# Patient Record
Sex: Female | Born: 1965 | Race: Black or African American | Hispanic: No | Marital: Married | Smoking: Never smoker
Health system: Southern US, Community
[De-identification: ages and names within clinical notes are randomized; demographics above are authoritative.]

## PROBLEM LIST (undated history)

## (undated) DIAGNOSIS — E079 Disorder of thyroid, unspecified: Secondary | ICD-10-CM

## (undated) DIAGNOSIS — M792 Neuralgia and neuritis, unspecified: Secondary | ICD-10-CM

## (undated) HISTORY — PX: BREAST BIOPSY: SHX20

---

## 1999-01-18 ENCOUNTER — Emergency Department (HOSPITAL_COMMUNITY): Admission: EM | Admit: 1999-01-18 | Discharge: 1999-01-18 | Payer: Self-pay | Admitting: Emergency Medicine

## 2005-04-03 ENCOUNTER — Ambulatory Visit (HOSPITAL_COMMUNITY): Admission: RE | Admit: 2005-04-03 | Discharge: 2005-04-03 | Payer: Self-pay | Admitting: Emergency Medicine

## 2005-04-03 ENCOUNTER — Emergency Department (HOSPITAL_COMMUNITY): Admission: EM | Admit: 2005-04-03 | Discharge: 2005-04-03 | Payer: Self-pay | Admitting: Emergency Medicine

## 2005-12-14 ENCOUNTER — Emergency Department (HOSPITAL_COMMUNITY): Admission: EM | Admit: 2005-12-14 | Discharge: 2005-12-14 | Payer: Self-pay | Admitting: Family Medicine

## 2006-11-01 HISTORY — PX: OTHER SURGICAL HISTORY: SHX169

## 2007-11-13 ENCOUNTER — Encounter: Admission: RE | Admit: 2007-11-13 | Discharge: 2007-11-13 | Payer: Self-pay | Admitting: Internal Medicine

## 2009-06-16 ENCOUNTER — Emergency Department (HOSPITAL_COMMUNITY): Admission: EM | Admit: 2009-06-16 | Discharge: 2009-06-16 | Payer: Self-pay | Admitting: Emergency Medicine

## 2011-07-07 ENCOUNTER — Other Ambulatory Visit: Payer: Self-pay | Admitting: Family Medicine

## 2011-07-07 DIAGNOSIS — Z1231 Encounter for screening mammogram for malignant neoplasm of breast: Secondary | ICD-10-CM

## 2011-07-14 ENCOUNTER — Ambulatory Visit (HOSPITAL_COMMUNITY)
Admission: RE | Admit: 2011-07-14 | Discharge: 2011-07-14 | Disposition: A | Discharge status: Home / Self Care | Payer: Self-pay | Source: Ambulatory Visit | Attending: Family Medicine | Admitting: Family Medicine

## 2011-07-14 DIAGNOSIS — Z1231 Encounter for screening mammogram for malignant neoplasm of breast: Secondary | ICD-10-CM

## 2011-10-23 ENCOUNTER — Emergency Department (HOSPITAL_COMMUNITY)
Admission: EM | Admit: 2011-10-23 | Discharge: 2011-10-24 | Discharge status: Against Medical Advice | Payer: Self-pay | Attending: Emergency Medicine | Admitting: Emergency Medicine

## 2011-10-23 DIAGNOSIS — Z0389 Encounter for observation for other suspected diseases and conditions ruled out: Secondary | ICD-10-CM | POA: Insufficient documentation

## 2011-10-24 ENCOUNTER — Encounter: Payer: Self-pay | Admitting: *Deleted

## 2011-10-24 NOTE — ED Notes (Signed)
Called patient to a room and patient did not answer

## 2011-10-24 NOTE — ED Notes (Signed)
Patient with abdominal pain on her right side for about a month.  Patient has been taking tylenol, bc powder for pain without any relief.

## 2011-10-28 ENCOUNTER — Emergency Department (HOSPITAL_COMMUNITY): Payer: Self-pay

## 2011-10-28 ENCOUNTER — Encounter (HOSPITAL_COMMUNITY): Payer: Self-pay | Admitting: *Deleted

## 2011-10-28 ENCOUNTER — Emergency Department (HOSPITAL_COMMUNITY)
Admission: EM | Admit: 2011-10-28 | Discharge: 2011-10-28 | Disposition: A | Discharge status: Home / Self Care | Payer: Self-pay | Attending: Emergency Medicine | Admitting: Emergency Medicine

## 2011-10-28 DIAGNOSIS — Z79899 Other long term (current) drug therapy: Secondary | ICD-10-CM | POA: Insufficient documentation

## 2011-10-28 DIAGNOSIS — R10819 Abdominal tenderness, unspecified site: Secondary | ICD-10-CM | POA: Insufficient documentation

## 2011-10-28 DIAGNOSIS — R112 Nausea with vomiting, unspecified: Secondary | ICD-10-CM | POA: Insufficient documentation

## 2011-10-28 DIAGNOSIS — D1803 Hemangioma of intra-abdominal structures: Secondary | ICD-10-CM | POA: Insufficient documentation

## 2011-10-28 DIAGNOSIS — R109 Unspecified abdominal pain: Secondary | ICD-10-CM | POA: Insufficient documentation

## 2011-10-28 DIAGNOSIS — R319 Hematuria, unspecified: Secondary | ICD-10-CM | POA: Insufficient documentation

## 2011-10-28 LAB — COMPREHENSIVE METABOLIC PANEL
AST: 22 U/L (ref 0–37)
Albumin: 3.8 g/dL (ref 3.5–5.2)
Calcium: 9.7 mg/dL (ref 8.4–10.5)
Glucose, Bld: 119 mg/dL — ABNORMAL HIGH (ref 70–99)
Sodium: 136 mEq/L (ref 135–145)
Total Protein: 7.3 g/dL (ref 6.0–8.3)

## 2011-10-28 LAB — URINALYSIS, ROUTINE W REFLEX MICROSCOPIC
Glucose, UA: NEGATIVE mg/dL
Ketones, ur: NEGATIVE mg/dL
Nitrite: NEGATIVE
Protein, ur: NEGATIVE mg/dL
Specific Gravity, Urine: 1.02 (ref 1.005–1.030)

## 2011-10-28 LAB — URINE MICROSCOPIC-ADD ON

## 2011-10-28 LAB — CBC
HCT: 39.8 % (ref 36.0–46.0)
Hemoglobin: 13.7 g/dL (ref 12.0–15.0)
MCH: 31.1 pg (ref 26.0–34.0)
Platelets: 213 10*3/uL (ref 150–400)
RBC: 4.4 MIL/uL (ref 3.87–5.11)

## 2011-10-28 LAB — DIFFERENTIAL
Basophils Absolute: 0 10*3/uL (ref 0.0–0.1)
Basophils Relative: 0 % (ref 0–1)
Eosinophils Absolute: 0 10*3/uL (ref 0.0–0.7)
Eosinophils Relative: 1 % (ref 0–5)
Lymphocytes Relative: 42 % (ref 12–46)
Lymphs Abs: 1.4 10*3/uL (ref 0.7–4.0)
Monocytes Relative: 7 % (ref 3–12)
Neutro Abs: 1.6 10*3/uL — ABNORMAL LOW (ref 1.7–7.7)

## 2011-10-28 LAB — LIPASE, BLOOD: Lipase: 32 U/L (ref 11–59)

## 2011-10-28 MED ORDER — HYDROMORPHONE HCL PF 1 MG/ML IJ SOLN
1.0000 mg | Freq: Once | INTRAMUSCULAR | Status: AC
  Administered 2011-10-28: 1 mg via INTRAVENOUS
  Filled 2011-10-28: qty 1

## 2011-10-28 MED ORDER — SODIUM CHLORIDE 0.9 % IV BOLUS (SEPSIS)
1000.0000 mL | Freq: Once | INTRAVENOUS | Status: AC
  Administered 2011-10-28: 1000 mL via INTRAVENOUS

## 2011-10-28 MED ORDER — TRAMADOL HCL 50 MG PO TABS: 50.0000 mg | ORAL_TABLET | Freq: Four times a day (QID) | ORAL | Status: AC | PRN

## 2011-10-28 MED ORDER — GADOBENATE DIMEGLUMINE 529 MG/ML IV SOLN
13.0000 mL | Freq: Once | INTRAVENOUS | Status: AC | PRN
  Administered 2011-10-28: 13 mL via INTRAVENOUS

## 2011-10-28 MED ORDER — KETOROLAC TROMETHAMINE 30 MG/ML IJ SOLN
30.0000 mg | Freq: Once | INTRAMUSCULAR | Status: AC
  Administered 2011-10-28: 30 mg via INTRAVENOUS
  Filled 2011-10-28: qty 1

## 2011-10-28 MED ORDER — ONDANSETRON HCL 4 MG/2ML IJ SOLN
4.0000 mg | Freq: Once | INTRAMUSCULAR | Status: AC
  Administered 2011-10-28: 4 mg via INTRAVENOUS
  Filled 2011-10-28: qty 2

## 2011-10-28 NOTE — ED Notes (Signed)
LMP--started 12/25--normal flow.

## 2011-10-28 NOTE — ED Notes (Signed)
Pt reports sharp pain in R side x2 months that has been worsening over last week. Using OTC pain meds without relief. Reports not being able to sleep at night due to pain.

## 2011-10-28 NOTE — ED Notes (Signed)
Spoke with MRI tech, will come to get patient in few minutes.

## 2011-10-28 NOTE — ED Notes (Signed)
Returned from MRI-alert/family at bedside.

## 2011-10-28 NOTE — ED Notes (Signed)
Pt remains at MRI

## 2011-10-28 NOTE — ED Provider Notes (Signed)
History     CSN: [id number]  Arrival date & time 10/28/11  0800   First MD Initiated Contact with Patient 10/28/11 0815      Chief Complaint  Patient presents with  . Flank Pain    (Consider location/radiation/quality/duration/timing/severity/associated sxs/prior treatment) HPI Comments: No associated urinary sx, vaginal sx, change in stool.  Has not had similar sx in past  Patient is a 45 y.o. female presenting with flank pain. The history is provided by the patient. No language interpreter was used.  Flank Pain This is a new problem. Episode onset: has been present for 2 months but worse in last 1 week. The problem occurs constantly. The problem has been gradually worsening. Associated symptoms include abdominal pain. Pertinent negatives include no chest pain, no headaches and no shortness of breath. The symptoms are aggravated by bending and twisting. The symptoms are relieved by nothing. She has tried acetaminophen (nsaid) for the symptoms. The treatment provided mild relief.    History reviewed. No pertinent past medical history.  History reviewed. No pertinent past surgical history.  No family history on file.  History  Substance Use Topics  . Smoking status: Never Smoker   . Smokeless tobacco: Not on file  . Alcohol Use: No    OB History    Grav Para Term Preterm Abortions TAB SAB Ect Mult Living                  Review of Systems  Constitutional: Negative for fever, activity change, appetite change and fatigue.  HENT: Negative for congestion, sore throat, rhinorrhea, neck pain and neck stiffness.   Respiratory: Negative for cough and shortness of breath.   Cardiovascular: Negative for chest pain and palpitations.  Gastrointestinal: Positive for nausea, vomiting and abdominal pain. Negative for diarrhea and constipation.  Genitourinary: Positive for flank pain. Negative for dysuria, urgency, frequency, vaginal bleeding and vaginal discharge.  Musculoskeletal:  Negative for myalgias and arthralgias.  Neurological: Negative for dizziness, weakness, light-headedness, numbness and headaches.  All other systems reviewed and are negative.    Allergies  Review of patient's allergies indicates no known allergies.  Home Medications   Current Outpatient Rx  Name Route Sig Dispense Refill  . ACETAMINOPHEN 500 MG PO TABS Oral Take 1,000 mg by mouth every 6 (six) hours as needed. pain     . AMITRIPTYLINE HCL 150 MG PO TABS Oral Take 150 mg by mouth at bedtime.      Deeann Dowse BODY PAIN PO Oral Take 1 packet by mouth 2 (two) times daily as needed. pain     . DOXYCYCLINE HYCLATE 100 MG PO TABS Oral Take 100 mg by mouth 2 (two) times daily. This is a cont rx. Pt takes 1 cap twice daily for acne.    Marland Kitchen NAPROXEN SODIUM 220 MG PO TABS Oral Take 220 mg by mouth 2 (two) times daily with a meal. pain     . NORETHIN-ETH ESTRAD TRIPHASIC 0.5/1/0.5-35 MG-MCG PO TABS Oral Take 1 tablet by mouth daily.      . TRAMADOL HCL 50 MG PO TABS Oral Take 1 tablet (50 mg total) by mouth every 6 (six) hours as needed for pain. Maximum dose= 8 tablets per day 15 tablet 0    BP 127/78  Pulse 61  Temp(Src) 98.7 F (37.1 C) (Oral)  Resp 16  SpO2 100%  LMP 10/26/2011  Physical Exam  Nursing note and vitals reviewed. Constitutional: She is oriented to person, place, and time. She appears  well-developed and well-nourished. No distress.  HENT:  Head: Normocephalic and atraumatic.  Mouth/Throat: Oropharynx is clear and moist. No oropharyngeal exudate.  Eyes: Conjunctivae and EOM are normal. Pupils are equal, round, and reactive to light.  Neck: Normal range of motion. Neck supple.  Cardiovascular: Normal rate, regular rhythm, normal heart sounds and intact distal pulses.  Exam reveals no gallop and no friction rub.   No murmur heard. Pulmonary/Chest: Effort normal and breath sounds normal. No respiratory distress.  Abdominal: Soft. Bowel sounds are normal. There is tenderness  (pain on palpation R lateral abdomen and flank.  No murphy sign or RLQ pain). There is no rebound and no guarding.       No cva tenderness  Musculoskeletal: Normal range of motion. She exhibits no tenderness.  Neurological: She is alert and oriented to person, place, and time. No cranial nerve deficit.  Skin: Skin is warm and dry.    ED Course  Procedures (including critical care time)  Labs Reviewed  CBC - Abnormal; Notable for the following:    WBC 3.3 (*)    All other components within normal limits  DIFFERENTIAL - Abnormal; Notable for the following:    Neutro Abs 1.6 (*)    All other components within normal limits  COMPREHENSIVE METABOLIC PANEL - Abnormal; Notable for the following:    Glucose, Bld 119 (*)    GFR calc non Af Amer 75 (*)    GFR calc Af Amer 87 (*)    All other components within normal limits  URINALYSIS, ROUTINE W REFLEX MICROSCOPIC - Abnormal; Notable for the following:    Hgb urine dipstick LARGE (*)    All other components within normal limits  URINE MICROSCOPIC-ADD ON - Abnormal; Notable for the following:    Squamous Epithelial / LPF FEW (*)    Bacteria, UA FEW (*)    All other components within normal limits  LIPASE, BLOOD   Ct Abdomen Pelvis Wo Contrast  10/28/2011  *RADIOLOGY REPORT*  Clinical Data: Right flank pain.  CT ABDOMEN AND PELVIS WITHOUT CONTRAST  Technique:  Multidetector CT imaging of the abdomen and pelvis was performed following the standard protocol without intravenous contrast.  Comparison: None  Findings: The lung bases are clear.  There are multiple liver lesions ( best seen on liver windows). These are not cysts but could be benign lesions such as hemangiomas or adenomas.  MRI without and with contrast is recommended for further evaluation.  The spleen is normal in size.  No focal lesions.  The pancreas is normal.  The adrenal glands and kidneys demonstrate no abnormalities.  No renal or obstructing ureteral calculi.  No mass lesions.   The stomach, duodenum, small bowel and colon are grossly normal without oral contrast.  The appendix is normal.  No mesenteric or retroperitoneal masses or lymphadenopathy.  The aorta is normal in caliber.  The uterus demonstrates multiple fibroids.  The ovaries are normal. The bladder is decompressed but otherwise normal.  No pelvic mass, adenopathy or free pelvic fluid collection.  No inguinal mass or hernia.  The bony structures are intact.  IMPRESSION:  1.  No renal or obstructing ureteral calculi identified. 2.  Multiple indeterminate liver lesions. Abdominal MRI without and with contrast is recommended for further evaluation and characterization.  Original Report Authenticated By: P. Loralie Champagne, M.D.   Mr Abdomen W Wo Contrast  10/28/2011  *RADIOLOGY REPORT*  Clinical Data: Multiple liver lesions seen on recent CT scan.  MRI ABDOMEN WITH  AND WITHOUT CONTRAST  Technique:  Multiplanar multisequence MR imaging of the abdomen was performed both before and after administration of intravenous contrast.  Contrast: 13mL MULTIHANCE GADOBENATE DIMEGLUMINE 529 MG/ML IV SOLN  Comparison: CT scan 10/28/2011.  Findings: As demonstrated on the recent CT scan there are multiple liver lesions.  There are 3 hemangiomas in the right hepatic lobe. The largest lesion measures 2.3 cm.  These demonstrate typical MR imaging features of hemangiomas.  The largest liver lesion is in the medial segment of the left lobe and measures a maximum of 4.2 cm.  This lesion demonstrates very slight increased T2 signal intensity and is smoothly marginated and well circumscribed.  It demonstrates arterial phase enhancement and washes out rapidly.  Delayed imaging shows slight central enhancement.  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 There is a second similar lesion in the right hepatic lobe inferiorly at the level of the upper pole the right kidney.  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 If this patient is on birth  control pills hepatic adenomas are also possible.  No biliary dilatation.  The gallbladder is normal.  No common bile duct dilatation.  The pancreas is normal.  The adrenal glands and kidneys are normal.  The spleen is normal.  No upper abdominal mass or lymphadenopathy.  The aorta is normal in caliber.  The major branch vessels are normal.  No significant bony findings.  IMPRESSION:  1.  Five benign appearing liver lesions.  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 Hepatic adenomas would be another consideration if the patient is on birth control pills.  Recommend follow-up MR examination in 6 months to document stability. 2.  The remainder of the abdomen is unremarkable.  Original Report Authenticated By: P. Loralie Champagne, M.D.     1. Flank pain   2. Hematuria   3. Hemangioma of liver       MDM  CT imaging showed some questionable nodules the liver. MRI is recommended to further evaluate these. The patient has no primary care physician therefore I performed an MRI. These are consistent with benign lesions. Remainder of her laboratory studies are relatively unremarkable except for the presence of hematuria. She is instructed to followup with her primary care physician. She is provided Ultram for pain. I did not prescribe an NSAID secondary to hematuria. She's instructed to return for worsening of her symptoms.        Dayton Bailiff, MD 10/28/11 1256

## 2012-02-14 ENCOUNTER — Emergency Department (HOSPITAL_COMMUNITY)
Admission: EM | Admit: 2012-02-14 | Discharge: 2012-02-14 | Disposition: A | Discharge status: Home / Self Care | Payer: Self-pay | Attending: Emergency Medicine | Admitting: Emergency Medicine

## 2012-02-14 ENCOUNTER — Encounter (HOSPITAL_COMMUNITY): Payer: Self-pay | Admitting: Emergency Medicine

## 2012-02-14 DIAGNOSIS — M79604 Pain in right leg: Secondary | ICD-10-CM

## 2012-02-14 DIAGNOSIS — M79609 Pain in unspecified limb: Secondary | ICD-10-CM | POA: Insufficient documentation

## 2012-02-14 MED ORDER — HYDROCODONE-ACETAMINOPHEN 5-325 MG PO TABS: 1.0000 | ORAL_TABLET | Freq: Once | ORAL | Status: DC

## 2012-02-14 MED ORDER — HYDROCODONE-ACETAMINOPHEN 5-325 MG PO TABS
ORAL_TABLET | ORAL | Status: AC
  Administered 2012-02-14: 23:00:00
  Filled 2012-02-14: qty 1

## 2012-02-14 MED ORDER — ENOXAPARIN SODIUM 60 MG/0.6ML ~~LOC~~ SOLN
1.0000 mg/kg | Freq: Once | SUBCUTANEOUS | Status: AC
  Administered 2012-02-14: 60 mg via SUBCUTANEOUS
  Filled 2012-02-14 (×2): qty 0.6

## 2012-02-14 NOTE — ED Notes (Signed)
Pt has a place behind her right knee that she states has been bothering her for a little over a week  Pt states it hurts at night  Pt states she also has one on her head as well  No drainage noted from the knee

## 2012-02-14 NOTE — Discharge Instructions (Signed)
We need to obtain an ultrasound of your legs to make sure you do not have a blood clot. You have been given an sheet which you will need to bring with you in the morning to the vascular lab for your study. You have been given an injection of Lovenox this evening to treat you in case this is a clot. You can try using a warm compress over the area this evening. If you develop shortness of breath, chest pain, increasing swelling or redness to the leg this evening, or any other worrisome symptoms, please return to the ER immediately.

## 2012-02-14 NOTE — ED Provider Notes (Signed)
History     CSN: [id number]  Arrival date & time 02/14/12  2011   First MD Initiated Contact with Patient 02/14/12 2112      Chief Complaint  Patient presents with  . Abscess    (Consider location/radiation/quality/duration/timing/severity/associated sxs/prior treatment) The history is provided by the patient.   46 year old female who presents with right leg pain. Pain is located in the popliteal fossa. She states that she has had pain to the area for about the past week, but just noticed swelling in the area today. No known injury or hx of the same symptoms. Pain is dull and throbbing and worsens with standing. No tx prior to arrival.  Patient has no prior history of DVT/PE. Denies recent trauma, surgery, or prolonged immobilization. No hx clotting disorders. Denies hemoptysis, cough, chest pain, shortness of breath. She is on oral contraceptives. She's not a smoker.  History reviewed. No pertinent past medical history.  History reviewed. No pertinent past surgical history.  Family History  Problem Relation Age of Onset  . Hypertension Mother   . Hypertension Father     History  Substance Use Topics  . Smoking status: Never Smoker   . Smokeless tobacco: Not on file  . Alcohol Use: No    OB History    Grav Para Term Preterm Abortions TAB SAB Ect Mult Living                  Review of Systems  Constitutional: Negative for fever, chills, activity change and appetite change.  HENT: Negative.   Respiratory: Negative for cough, chest tightness and shortness of breath.   Cardiovascular: Negative for chest pain, palpitations and leg swelling.  Gastrointestinal: Negative for nausea, vomiting and abdominal pain.  Musculoskeletal: Negative for joint swelling.  Skin: Negative for color change and rash.  Neurological: Negative for dizziness, weakness and numbness.    Allergies  Review of patient's allergies indicates no known allergies.  Home Medications   Current  Outpatient Rx  Name Route Sig Dispense Refill  . AMITRIPTYLINE HCL 150 MG PO TABS Oral Take 150 mg by mouth at bedtime.      Marland Kitchen BIOTIN 10 MG PO TABS Oral Take 1 tablet by mouth daily.    Marland Kitchen CALCIUM CARBONATE-VITAMIN D 250-125 MG-UNIT PO TABS Oral Take 1 tablet by mouth daily.    Marland Kitchen DOXYCYCLINE HYCLATE 100 MG PO TABS Oral Take 100 mg by mouth 2 (two) times daily. This is a cont rx. Pt takes 1 cap twice daily for acne.    Kathrynn Running ESTRAD TRIPHASIC 0.5/1/0.5-35 MG-MCG PO TABS Oral Take 1 tablet by mouth daily.        BP 143/83  Pulse 68  Temp(Src) 97.8 F (36.6 C) (Oral)  Resp 16  Ht 5\' 4"  (1.626 m)  Wt 135 lb (61.236 kg)  BMI 23.17 kg/m2  SpO2 100%  LMP 01/23/2012  Physical Exam  Nursing note and vitals reviewed. Constitutional: She appears well-developed and well-nourished. No distress.  HENT:  Head: Normocephalic and atraumatic.  Eyes: EOM are normal. Pupils are equal, round, and reactive to light.  Neck: Normal range of motion.  Cardiovascular: Normal rate, regular rhythm and normal heart sounds.   Pulmonary/Chest: Effort normal and breath sounds normal. No respiratory distress. She exhibits no tenderness.  Abdominal: Soft. There is no tenderness.  Musculoskeletal:       Legs:      Right popliteal fossa: Area with mild edema. No overlying ecchymoses, erythema noted. Area is TTP  without palp cord. Calf NT to palp without notable edema. Knee with FROM, ligaments grossly intact to drawer/stress testing. NVI with DP/PT pulses intact.  Neurological: She is alert.  Skin: Skin is warm and dry. She is not diaphoretic.  Psychiatric: She has a normal mood and affect.    ED Course  Procedures (including critical care time)  Labs Reviewed - No data to display No results found.   1. Right leg pain       MDM  Patient with tenderness and swelling behind right knee without known injury. No evidence to suggest bony injury which would necessitate XR. This appears to be less  likely consistent with a Baker's cyst. This may be a superficial thrombophlebitis or varicose veins, but will proceed with [place] to r/o DVT. She has no symptoms to suggest PE at this time. Her vital signs are stable; she is not hypoxic, tachypneic, or tachycardic. Vascular is not currently in house, so cannot obtain US this evening - will plan to bring her back in the AM for a study. Prophylactic injection of Lovenox given prior to discharge, and she was given directions and a copy of the order form. Discussed reasons to return to ED sooner. Pt verbalized understanding and was agreeable to plan.        Grant Fontana, [place] 02/15/12 [phone]

## 2012-02-14 NOTE — ED Notes (Signed)
Pt. With right leg pain for about 3 weeks.  Pt. Noticed today she has a swollen area behind her right knee.  Rates pain as an 8.

## 2012-02-15 ENCOUNTER — Ambulatory Visit (HOSPITAL_COMMUNITY)
Admission: RE | Admit: 2012-02-15 | Discharge: 2012-02-15 | Disposition: A | Discharge status: Home / Self Care | Payer: Self-pay | Source: Ambulatory Visit | Attending: Emergency Medicine | Admitting: Emergency Medicine

## 2012-02-15 DIAGNOSIS — M7120 Synovial cyst of popliteal space [Baker], unspecified knee: Secondary | ICD-10-CM | POA: Insufficient documentation

## 2012-02-15 DIAGNOSIS — R52 Pain, unspecified: Secondary | ICD-10-CM

## 2012-02-15 DIAGNOSIS — M7989 Other specified soft tissue disorders: Secondary | ICD-10-CM | POA: Insufficient documentation

## 2012-02-15 DIAGNOSIS — M79609 Pain in unspecified limb: Secondary | ICD-10-CM | POA: Insufficient documentation

## 2012-02-15 NOTE — ED Provider Notes (Signed)
Medical screening examination/treatment/procedure(s) were performed by non-physician practitioner and as supervising physician I was immediately available for consultation/collaboration.  Haziel Molner K Linker, MD 02/15/12 1524 

## 2012-02-15 NOTE — Progress Notes (Signed)
Right lower extremity venous duplex completed.  Preliminary report is negative for DVT or SVT.  There appears to be a Baker's cyst in the right pop fossa. 

## 2012-05-23 ENCOUNTER — Emergency Department (HOSPITAL_COMMUNITY): Payer: Self-pay

## 2012-05-23 ENCOUNTER — Other Ambulatory Visit: Payer: Self-pay

## 2012-05-23 ENCOUNTER — Emergency Department (HOSPITAL_COMMUNITY)
Admission: EM | Admit: 2012-05-23 | Discharge: 2012-05-23 | Disposition: A | Discharge status: Home / Self Care | Payer: Self-pay | Attending: Emergency Medicine | Admitting: Emergency Medicine

## 2012-05-23 ENCOUNTER — Encounter (HOSPITAL_COMMUNITY): Payer: Self-pay | Admitting: Emergency Medicine

## 2012-05-23 DIAGNOSIS — J029 Acute pharyngitis, unspecified: Secondary | ICD-10-CM | POA: Insufficient documentation

## 2012-05-23 DIAGNOSIS — E041 Nontoxic single thyroid nodule: Secondary | ICD-10-CM | POA: Insufficient documentation

## 2012-05-23 DIAGNOSIS — M542 Cervicalgia: Secondary | ICD-10-CM | POA: Insufficient documentation

## 2012-05-23 DIAGNOSIS — Z79899 Other long term (current) drug therapy: Secondary | ICD-10-CM | POA: Insufficient documentation

## 2012-05-23 DIAGNOSIS — E0789 Other specified disorders of thyroid: Secondary | ICD-10-CM

## 2012-05-23 LAB — COMPREHENSIVE METABOLIC PANEL
Albumin: 4 g/dL (ref 3.5–5.2)
BUN: 9 mg/dL (ref 6–23)
Chloride: 100 mEq/L (ref 96–112)
Creatinine, Ser: 0.85 mg/dL (ref 0.50–1.10)
GFR calc Af Amer: >90 mL/min (ref >90)
GFR calc non Af Amer: 82 mL/min — ABNORMAL LOW (ref >90)
Total Bilirubin: 0.4 mg/dL (ref 0.3–1.2)

## 2012-05-23 LAB — CBC WITH DIFFERENTIAL/PLATELET
Basophils Relative: 0 % (ref 0–1)
HCT: 40.9 % (ref 36.0–46.0)
Hemoglobin: 14.1 g/dL (ref 12.0–15.0)
MCH: 31.1 pg (ref 26.0–34.0)
MCHC: 34.5 g/dL (ref 30.0–36.0)
Monocytes Absolute: 0.3 10*3/uL (ref 0.1–1.0)
Monocytes Relative: 6 % (ref 3–12)
Neutro Abs: 3.7 10*3/uL (ref 1.7–7.7)

## 2012-05-23 NOTE — ED Provider Notes (Signed)
History     CSN: [id number]  Arrival date & time 05/23/12  4098   First MD Initiated Contact with Patient 05/23/12 [phone]      Chief Complaint  Patient presents with  . Sore Throat    (Consider location/radiation/quality/duration/timing/severity/associated sxs/prior treatment) HPI  Patient reports she has had a sore throat since July 9. She indicates she does not have pain up in her throat but she points over the area around her thyroid/glottis. She states it hurts when she swallows. She denies any hoarseness. She denies any swelling in her neck, fever, diarrhea, constipation, nausea, or vomiting. She states she has not felt lightheaded. She denies feeling palpitations but does state she feels anxious and put her hands over the upper portion of her chest. She also states she has had loss of appetite the past few days but she's also been going through some family problems. She states in December she weighed 135 pounds, in February she weighed 132 pounds and currently she weighs 127 pounds, she states she however has changed her diet has been cutting out snacks and sodas to eat more healthy.  PCP none GYN health department  History reviewed. No pertinent past medical history.  History reviewed. No pertinent past surgical history.  Family History  Problem Relation Age of Onset  . Hypertension Mother   . Hypertension Father     History  Substance Use Topics  . Smoking status: Never Smoker   . Smokeless tobacco: Not on file  . Alcohol Use: Yes     occasionally--wine   employed  OB History    Grav Para Term Preterm Abortions TAB SAB Ect Mult Living                  Review of Systems  All other systems reviewed and are negative.    Allergies  Review of patient's allergies indicates no known allergies.  Home Medications   Current Outpatient Rx  Name Route Sig Dispense Refill  . AMITRIPTYLINE HCL 150 MG PO TABS Oral Take 150 mg by mouth at bedtime.      Marland Kitchen BIOTIN 10 MG  PO TABS Oral Take 1 tablet by mouth daily.    Marland Kitchen CALCIUM CARBONATE-VITAMIN D 250-125 MG-UNIT PO TABS Oral Take 1 tablet by mouth daily.    . ADULT MULTIVITAMIN W/MINERALS CH Oral Take 1 tablet by mouth daily.    Kathrynn Running ESTRAD TRIPHASIC 0.5/1/0.5-35 MG-MCG PO TABS Oral Take 1 tablet by mouth daily.        BP 107/90  Pulse 100  Temp 98.2 F (36.8 C) (Oral)  Resp 20  SpO2 100%  LMP 05/12/2012  Vital signs normal except tachycardia   Physical Exam  Nursing note and vitals reviewed. Constitutional: She is oriented to person, place, and time. She appears well-developed and well-nourished.  Non-toxic appearance. She does not appear ill. No distress.  HENT:  Head: Normocephalic and atraumatic.  Right Ear: External ear normal.  Left Ear: External ear normal.  Nose: Nose normal. No mucosal edema or rhinorrhea.  Mouth/Throat: Oropharynx is clear and moist and mucous membranes are normal. No dental abscesses or uvula swelling.  Eyes: Conjunctivae and EOM are normal. Pupils are equal, round, and reactive to light.  Neck: Normal range of motion and full passive range of motion without pain. Neck supple. No thyromegaly present.       Patient has no obvious visible thyromegaly, her thyroid is easily felt but does not feel enlarged. However patient does indicate  her throat is tender and when she presses on it she is pressing over the thyroid glands bilaterally.PT has to push into her neck deeply to show me where she is painful.   Cardiovascular: Regular rhythm and normal heart sounds.  Tachycardia present.  Exam reveals no gallop and no friction rub.   No murmur heard. Pulmonary/Chest: Effort normal and breath sounds normal. No respiratory distress. She has no wheezes. She has no rhonchi. She has no rales. She exhibits no tenderness and no crepitus.  Abdominal: Soft. Normal appearance and bowel sounds are normal. She exhibits no distension. There is no tenderness. There is no rebound and no  guarding.  Musculoskeletal: Normal range of motion. She exhibits no edema and no tenderness.       Moves all extremities well.   Neurological: She is alert and oriented to person, place, and time. She has normal strength. No cranial nerve deficit.  Skin: Skin is warm, dry and intact. No rash noted. No erythema. No pallor.  Psychiatric: She has a normal mood and affect. Her speech is normal and behavior is normal. Her mood appears not anxious.    ED Course  Procedures (including critical care time)  Pt states her pain is only an ache and she only tried OTC meds once. We have discussed her results and need to f/u at [facility] to get her thryoid test results.   Results for orders placed during the hospital encounter of 05/23/12  CBC WITH DIFFERENTIAL      Component Value Range   WBC 5.7  4.0 - 10.5 K/uL   RBC 4.53  3.87 - 5.11 MIL/uL   Hemoglobin 14.1  12.0 - 15.0 g/dL   HCT 16.1  09.6 - 04.5 %   MCV 90.3  78.0 - 100.0 fL   MCH 31.1  26.0 - 34.0 pg   MCHC 34.5  30.0 - 36.0 g/dL   RDW 40.9  81.1 - 91.4 %   Platelets 205  150 - 400 K/uL   Neutrophils Relative 65  43 - 77 %   Neutro Abs 3.7  1.7 - 7.7 K/uL   Lymphocytes Relative 29  12 - 46 %   Lymphs Abs 1.7  0.7 - 4.0 K/uL   Monocytes Relative 6  3 - 12 %   Monocytes Absolute 0.3  0.1 - 1.0 K/uL   Eosinophils Relative 1  0 - 5 %   Eosinophils Absolute 0.0  0.0 - 0.7 K/uL   Basophils Relative 0  0 - 1 %   Basophils Absolute 0.0  0.0 - 0.1 K/uL  COMPREHENSIVE METABOLIC PANEL      Component Value Range   Sodium 135  135 - 145 mEq/L   Potassium 3.4 (*) 3.5 - 5.1 mEq/L   Chloride 100  96 - 112 mEq/L   CO2 24  19 - 32 mEq/L   Glucose, Bld 81  70 - 99 mg/dL   BUN 9  6 - 23 mg/dL   Creatinine, Ser 7.82  0.50 - 1.10 mg/dL   Calcium 9.3  8.4 - 95.6 mg/dL   Total Protein 7.7  6.0 - 8.3 g/dL   Albumin 4.0  3.5 - 5.2 g/dL   AST 21  0 - 37 U/L   ALT 17  0 - 35 U/L   Alkaline Phosphatase 45  39 - 117 U/L   Total Bilirubin 0.4  0.3 - 1.2  mg/dL   GFR calc non Af Amer 82 (*) >90 mL/min  GFR calc Af Amer >90  >90 mL/min   Laboratory interpretation all normal except mild hypokalemia    US Soft Tissue Head/neck  05/23/2012  *RADIOLOGY REPORT*  Clinical Data: Tender thyroid, midline neck pain  THYROID ULTRASOUND  Technique: Ultrasound examination of the thyroid gland and adjacent soft tissues was performed.  Comparison:  None  Findings:  Right thyroid lobe:  4.5 x 1.4 x 1.4 cm.  Homogeneous echogenicity. Left thyroid lobe:  4.2 x 1.3 x 1.6 cm.  Inhomogeneous echogenicity. Isthmus:  1 mm thick  Focal nodules:  Bilateral thyroid nodules identified measuring up to 3 mm diameter on the right and up to 10 x 7 x 10 mm on the left. No thyroid calcifications or cysts.  Lymphadenopathy:  None visualized.  IMPRESSION: Small bilateral thyroid nodules up to 10 mm greatest size. Findings do not meet current SRU consensus criteria for biopsy. Follow-up by clinical exam is recommended.  If patient has known risk factors for thyroid carcinoma, consider follow-up ultrasound in 12 months.  If patient is clinically hyperthyroid, consider nuclear medicine thyroid uptake and scan.  Original Report Authenticated By: Lollie Marrow, M.D.    Date: 05/23/2012  Rate: 91  Rhythm: normal sinus rhythm  QRS Axis: normal  Intervals: normal  ST/T Wave abnormalities: normal  Conduction Disutrbances:none  Narrative Interpretation:   Old EKG Reviewed: none available    1. Thyroid nodule   2. Thyroid pain     Plan discharge  Devoria Albe, MD, FACEP    MDM          Ward Givens, MD 05/23/12 1125

## 2012-05-23 NOTE — ED Notes (Signed)
Pt transported to ultrasound.

## 2012-05-23 NOTE — ED Notes (Signed)
Pt states throat is sore. Took Halls, gargled with warm salt water, and Dayquil, states soothed some. Pt states nasal congestion last week and yesterday. Pt c/o loss of appetite. Pt denies fever. Pt denies nausea, vomiting, diarrhea.

## 2012-05-23 NOTE — ED Notes (Signed)
Pt states left ear hurts occasionally when throat is sore.

## 2012-05-25 ENCOUNTER — Encounter (HOSPITAL_COMMUNITY): Payer: Self-pay

## 2012-05-25 ENCOUNTER — Emergency Department (INDEPENDENT_AMBULATORY_CARE_PROVIDER_SITE_OTHER)
Admission: EM | Admit: 2012-05-25 | Discharge: 2012-05-25 | Disposition: A | Discharge status: Home / Self Care | Payer: Self-pay | Source: Home / Self Care | Attending: Emergency Medicine | Admitting: Emergency Medicine

## 2012-05-25 DIAGNOSIS — E069 Thyroiditis, unspecified: Secondary | ICD-10-CM

## 2012-05-25 HISTORY — DX: Neuralgia and neuritis, unspecified: M79.2

## 2012-05-25 MED ORDER — PREDNISONE 10 MG PO TABS: ORAL_TABLET | ORAL | Status: DC

## 2012-05-25 MED ORDER — NAPROXEN 500 MG PO TABS: 500.0000 mg | ORAL_TABLET | Freq: Two times a day (BID) | ORAL | Status: AC

## 2012-05-25 NOTE — ED Provider Notes (Signed)
Chief Complaint  Patient presents with  . Thyroid Problem    History of Present Illness:   The patient is a 46 year old female with a two-week history of thyroid pain and soreness, pain on swallowing, weight loss, cold intolerance, night sweats, fatigue, rapid heartbeat, anxiety, hair loss, tremor, constipation, and generalized joint pain. She has had no prior history of thyroid disease. She was seen 2 days ago in the emergency room. She had a thyroid ultrasound which revealed a few small thyroid nodules which were not enough to warrant biopsy. Total T4 and TSH were drawn which came back within the normal range. She also had a normal CBC, complete metabolic panel, and EKG. She was referred to come here today for followup on her thyroid tests.  Review of Systems:  Other than noted above, the patient denies any of the following symptoms. Systemic:  No fever, chills, sweats, fatigue, myalgias, headache, or anorexia. Eye:  No redness, pain or drainage. ENT:  No earache, nasal congestion, rhinorrhea, sinus pressure, or sore throat. Lungs:  No cough, sputum production, wheezing, shortness of breath.  Cardiovascular:  No chest pain, palpitations, or syncope. GI:  No nausea, vomiting, abdominal pain or diarrhea. GU:  No dysuria, frequency, or hematuria. Skin:  No rash or pruritis.  PMFSH:  Past medical history, family history, social history, meds, and allergies were reviewed.  Physical Exam:   Vital signs:  BP 148/95  Pulse 95  Temp 98.3 F (36.8 C) (Oral)  Resp 12  SpO2 99%  LMP 05/07/2012 General:  Alert, in no distress. Eye:  PERRL, full EOMs. No stare or lid lag. Lids and conjunctivas were normal. ENT:  TMs and canals were normal, without erythema or inflammation.  Nasal mucosa was clear and uncongested, without drainage.  Mucous membranes were moist.  Pharynx was clear, without exudate or drainage.  There were no oral ulcerations or lesions. Neck:  Supple, no adenopathy, tenderness or  mass. Thyroid was normal in size and without palpable nodules, but very tender to palpation. Lungs:  No respiratory distress.  Lungs were clear to auscultation, without wheezes, rales or rhonchi.  Breath sounds were clear and equal bilaterally. Heart:  Regular rhythm, without gallops, murmers or rubs. Abdomen:  Soft, flat, and non-tender to palpation.  No hepatosplenomagaly or mass. Skin:  Clear, warm, and dry, without rash or lesions. Neurological exam: She has a mild tremor of her outstretched hands and hyperreflexia.  Labs:   Results for orders placed during the hospital encounter of 05/23/12  T4      Component Value Range   T4, Total 9.7  5.0 - 12.5 ug/dL  TSH      Component Value Range   TSH 2.535  0.350 - 4.500 uIU/mL  CBC WITH DIFFERENTIAL      Component Value Range   WBC 5.7  4.0 - 10.5 K/uL   RBC 4.53  3.87 - 5.11 MIL/uL   Hemoglobin 14.1  12.0 - 15.0 g/dL   HCT 09.8  11.9 - 14.7 %   MCV 90.3  78.0 - 100.0 fL   MCH 31.1  26.0 - 34.0 pg   MCHC 34.5  30.0 - 36.0 g/dL   RDW 82.9  56.2 - 13.0 %   Platelets 205  150 - 400 K/uL   Neutrophils Relative 65  43 - 77 %   Neutro Abs 3.7  1.7 - 7.7 K/uL   Lymphocytes Relative 29  12 - 46 %   Lymphs Abs 1.7  0.7 -  4.0 K/uL   Monocytes Relative 6  3 - 12 %   Monocytes Absolute 0.3  0.1 - 1.0 K/uL   Eosinophils Relative 1  0 - 5 %   Eosinophils Absolute 0.0  0.0 - 0.7 K/uL   Basophils Relative 0  0 - 1 %   Basophils Absolute 0.0  0.0 - 0.1 K/uL  COMPREHENSIVE METABOLIC PANEL      Component Value Range   Sodium 135  135 - 145 mEq/L   Potassium 3.4 (*) 3.5 - 5.1 mEq/L   Chloride 100  96 - 112 mEq/L   CO2 24  19 - 32 mEq/L   Glucose, Bld 81  70 - 99 mg/dL   BUN 9  6 - 23 mg/dL   Creatinine, Ser 4.09  0.50 - 1.10 mg/dL   Calcium 9.3  8.4 - 81.1 mg/dL   Total Protein 7.7  6.0 - 8.3 g/dL   Albumin 4.0  3.5 - 5.2 g/dL   AST 21  0 - 37 U/L   ALT 17  0 - 35 U/L   Alkaline Phosphatase 45  39 - 117 U/L   Total Bilirubin 0.4  0.3 -  1.2 mg/dL   GFR calc non Af Amer 82 (*) >90 mL/min   GFR calc Af Amer >90  >90 mL/min     Radiology:  US Soft Tissue Head/neck  05/23/2012  *RADIOLOGY REPORT*  Clinical Data: Tender thyroid, midline neck pain  THYROID ULTRASOUND  Technique: Ultrasound examination of the thyroid gland and adjacent soft tissues was performed.  Comparison:  None  Findings:  Right thyroid lobe:  4.5 x 1.4 x 1.4 cm.  Homogeneous echogenicity. Left thyroid lobe:  4.2 x 1.3 x 1.6 cm.  Inhomogeneous echogenicity. Isthmus:  1 mm thick  Focal nodules:  Bilateral thyroid nodules identified measuring up to 3 mm diameter on the right and up to 10 x 7 x 10 mm on the left. No thyroid calcifications or cysts.  Lymphadenopathy:  None visualized.  IMPRESSION: Small bilateral thyroid nodules up to 10 mm greatest size. Findings do not meet current SRU consensus criteria for biopsy. Follow-up by clinical exam is recommended.  If patient has known risk factors for thyroid carcinoma, consider follow-up ultrasound in 12 months.  If patient is clinically hyperthyroid, consider nuclear medicine thyroid uptake and scan.  Original Report Authenticated By: Lollie Marrow, M.D.    Assessment:  The encounter diagnosis was Thyroiditis. Even though her thyroid studies came back within normal range, I think she has subacute thyroiditis, most likely of viral etiology. They should get better with time but she will need followup with an endocrinologist, and we will make an appointment for her to see Dr. Talmage Nap. In the meantime, we'll start treatment with naproxen and prednisone.  Plan:   1.  The following meds were prescribed:   New Prescriptions   NAPROXEN (NAPROSYN) 500 MG TABLET    Take 1 tablet (500 mg total) by mouth 2 (two) times daily.   PREDNISONE (DELTASONE) 10 MG TABLET    Take 4 tabs daily for 4 days, 3 tabs daily for 4 days, 2 tabs daily for 4 days, then 1 tab daily for 4 days.   2.  The patient was instructed in symptomatic care and  handouts were given. 3.  The patient was told to return if becoming worse in any way, if no better in 3 or 4 days, and given some red flag symptoms that would indicate earlier return.  Reuben Likes, MD 05/25/12 [phone]

## 2012-05-25 NOTE — ED Notes (Signed)
Pt seen at [facility] ED 2 days ago for neck and throat pain.  States she had thyroid scan and labs drawn.  Reports she had 2 thyroid nodules and was told to return here today for lab results and possible referral to endocrinologist.  States she has been having hair loss and joint pain for months and wt loss, fatigue, tachycardia, insomnia and wt loss for several weeks.

## 2012-05-29 ENCOUNTER — Telehealth (HOSPITAL_COMMUNITY): Payer: Self-pay | Admitting: *Deleted

## 2012-05-29 NOTE — ED Notes (Signed)
Fax received from [facility] for appointment with Dr. Talmage Nap 8/5 @ 1600. I called and left a message to call. Vassie Moselle 05/29/2012

## 2012-05-29 NOTE — ED Notes (Signed)
7/25  Referral request received from Dr. Lorenz Coaster for pt. to f/u with Dr. Talmage Nap for Thyroiditis- also has a small nodule on her Thyroid gland.  Chart and demographic form faxed and confirmation received.  7/26 Fax received from [facility] with appt. for 8/5 @ 1600 with Dr. Talmage Nap.  I called pt. and left a message to call. Stacie Perry 05/29/2012

## 2012-05-29 NOTE — ED Notes (Signed)
Pt. called back and given appointment date, time, address, instructed to arrive 15 min. early to do paperwork and bring a copay.  She said they had already called her. I told her I did not know because the fax they sent told me to notify her. Vassie Moselle 05/29/2012

## 2012-10-03 ENCOUNTER — Other Ambulatory Visit: Payer: Self-pay | Admitting: Endocrinology

## 2012-10-03 DIAGNOSIS — E049 Nontoxic goiter, unspecified: Secondary | ICD-10-CM

## 2012-10-05 ENCOUNTER — Other Ambulatory Visit: Payer: Self-pay

## 2012-10-16 ENCOUNTER — Other Ambulatory Visit: Payer: Self-pay

## 2012-10-30 ENCOUNTER — Other Ambulatory Visit (HOSPITAL_COMMUNITY): Payer: Self-pay | Admitting: Physician Assistant

## 2012-10-30 DIAGNOSIS — Z1231 Encounter for screening mammogram for malignant neoplasm of breast: Secondary | ICD-10-CM

## 2012-10-31 ENCOUNTER — Ambulatory Visit
Admission: RE | Admit: 2012-10-31 | Discharge: 2012-10-31 | Disposition: A | Discharge status: Home / Self Care | Payer: No Typology Code available for payment source | Source: Ambulatory Visit | Attending: Endocrinology | Admitting: Endocrinology

## 2012-10-31 DIAGNOSIS — E049 Nontoxic goiter, unspecified: Secondary | ICD-10-CM

## 2012-11-10 ENCOUNTER — Ambulatory Visit (HOSPITAL_COMMUNITY)
Admission: RE | Admit: 2012-11-10 | Discharge: 2012-11-10 | Disposition: A | Discharge status: Home / Self Care | Payer: Self-pay | Source: Ambulatory Visit | Attending: Physician Assistant | Admitting: Physician Assistant

## 2012-11-10 DIAGNOSIS — Z1231 Encounter for screening mammogram for malignant neoplasm of breast: Secondary | ICD-10-CM

## 2013-04-13 ENCOUNTER — Emergency Department (INDEPENDENT_AMBULATORY_CARE_PROVIDER_SITE_OTHER)
Admission: EM | Admit: 2013-04-13 | Discharge: 2013-04-13 | Disposition: A | Discharge status: Home / Self Care | Payer: BC Managed Care – PPO | Source: Home / Self Care

## 2013-04-13 ENCOUNTER — Encounter (HOSPITAL_COMMUNITY): Payer: Self-pay

## 2013-04-13 DIAGNOSIS — M791 Myalgia, unspecified site: Secondary | ICD-10-CM

## 2013-04-13 DIAGNOSIS — IMO0001 Reserved for inherently not codable concepts without codable children: Secondary | ICD-10-CM

## 2013-04-13 LAB — POCT URINALYSIS DIP (DEVICE)
Glucose, UA: NEGATIVE mg/dL
Leukocytes, UA: NEGATIVE
Nitrite: NEGATIVE
Specific Gravity, Urine: 1.01 (ref 1.005–1.030)
Urobilinogen, UA: 0.2 mg/dL (ref 0.0–1.0)

## 2013-04-13 LAB — POCT PREGNANCY, URINE: Preg Test, Ur: NEGATIVE

## 2013-04-13 MED ORDER — IBUPROFEN 200 MG PO TABS: 400.0000 mg | ORAL_TABLET | Freq: Four times a day (QID) | ORAL | Status: AC | PRN

## 2013-04-13 MED ORDER — TROLAMINE SALICYLATE 10 % EX CREA: TOPICAL_CREAM | CUTANEOUS | Status: DC | PRN

## 2013-04-13 NOTE — ED Provider Notes (Signed)
History     CSN: [id number]  Arrival date & time 04/13/13  1038   None     Chief Complaint  Patient presents with  . Flank Pain    (Consider location/radiation/quality/duration/timing/severity/associated sxs/prior treatment) Patient is a 47 y.o. female presenting with flank pain.  Flank Pain   This is a 47 year old female who presents with right-sided flank pain which has been present for one year. She has had previous workups in the ER and nothing was ever found. Upon reviewing her history and her chart I find that she's had a CT scan and an MRI of her abdomen and pelvis and no pathology was noted other than hemangiomas in the liver. The patient states that pain is almost constant sometimes it's severe enough to wake her up at night and is exacerbated by certain movements. It is almost always just present in the flank and does not radiate down her legs or into her abdomen or back. Past Medical History  Diagnosis Date  . Neuropathic pain     History reviewed. No pertinent past surgical history.  Family History  Problem Relation Age of Onset  . Hypertension Mother   . Hypertension Father     History  Substance Use Topics  . Smoking status: Never Smoker   . Smokeless tobacco: Not on file  . Alcohol Use: No     Comment: occasionally--wine     OB History   Grav Para Term Preterm Abortions TAB SAB Ect Mult Living                  Review of Systems  Constitutional: Negative.   HENT: Negative.   Eyes: Negative.   Respiratory: Negative.   Cardiovascular: Negative.   Gastrointestinal: Negative.   Genitourinary: Positive for flank pain. Negative for dysuria and enuresis.  Musculoskeletal: Negative.   Skin: Negative.   Neurological: Negative.   Hematological: Negative.   Psychiatric/Behavioral: Negative.     Allergies  Review of patient's allergies indicates no known allergies.  Home Medications   Current Outpatient Rx  Name  Route  Sig  Dispense  Refill  .  amitriptyline (ELAVIL) 150 MG tablet   Oral   Take 150 mg by mouth at bedtime.           Marland Kitchen levothyroxine (SYNTHROID, LEVOTHROID) 25 MCG tablet   Oral   Take 25 mcg by mouth daily before breakfast.         . Norethindrone-Ethinyl Estradiol Triphasic (TRI-NORINYL, 28,) 0.5/1/0.5-35 MG-MCG tablet   Oral   Take 1 tablet by mouth daily.           . Biotin 10 MG TABS   Oral   Take 1 tablet by mouth daily.         . calcium-vitamin D (OSCAL WITH D) 250-125 MG-UNIT per tablet   Oral   Take 1 tablet by mouth daily.         Marland Kitchen ibuprofen (MOTRIN IB) 200 MG tablet   Oral   Take 2 tablets (400 mg total) by mouth every 6 (six) hours as needed for pain.   30 tablet   0   . Multiple Vitamin (MULTIVITAMIN WITH MINERALS) TABS   Oral   Take 1 tablet by mouth daily.         . naproxen (NAPROSYN) 500 MG tablet   Oral   Take 1 tablet (500 mg total) by mouth 2 (two) times daily.   30 tablet   0   . predniSONE (  DELTASONE) 10 MG tablet      Take 4 tabs daily for 4 days, 3 tabs daily for 4 days, 2 tabs daily for 4 days, then 1 tab daily for 4 days.   40 tablet   0   . trolamine salicylate (ASPERCREME/ALOE) 10 % cream   Topical   Apply topically as needed.   85 g   0     BP 133/87  Pulse 86  Temp(Src) 97.4 F (36.3 C) (Oral)  SpO2 100%  LMP 04/08/2013  Physical Exam  Constitutional: She is oriented to person, place, and time. She appears well-developed and well-nourished.  HENT:  Head: Normocephalic and atraumatic.  Eyes: Conjunctivae are normal. Pupils are equal, round, and reactive to light.  Neck: Normal range of motion. Neck supple.  Cardiovascular: Normal rate and regular rhythm.   Pulmonary/Chest: Effort normal and breath sounds normal.  Abdominal: Soft. Bowel sounds are normal.  Tenderness in right flank along the the midaxillary line extending from just below the rib cage to just above the iliac crest. No rash, no rebound tenderness.  Musculoskeletal: Normal  range of motion.  Neurological: She is alert and oriented to person, place, and time.  Skin: Skin is warm and dry.  Psychiatric: She has a normal mood and affect. Her behavior is normal.    ED Course  Procedures (including critical care time)  Labs Reviewed  POCT URINALYSIS DIP (DEVICE) - Abnormal; Notable for the following:    Hgb urine dipstick TRACE (*)    All other components within normal limits  POCT PREGNANCY, URINE   No results found.   1. Muscle ache       MDM  Ibuprofen, Aspercreme and gentle muscle stretching exercises recommended it is advised that if no improvement is seen she can followup with a sports medicine physician and officially be prescribed outpatient physical therapy.        Calvert Cantor, MD 04/13/13 1144

## 2013-04-13 NOTE — ED Notes (Signed)
Reports right flank pain for ~1 year; states she was told at another ED, that she had "flank pain" and has been using OTC medications for pain w little relief, and has reportedly been passing blood in her UA

## 2013-07-31 ENCOUNTER — Other Ambulatory Visit: Payer: Self-pay | Admitting: Endocrinology

## 2013-07-31 DIAGNOSIS — E049 Nontoxic goiter, unspecified: Secondary | ICD-10-CM

## 2014-01-23 ENCOUNTER — Ambulatory Visit
Admission: RE | Admit: 2014-01-23 | Discharge: 2014-01-23 | Disposition: A | Discharge status: Home / Self Care | Payer: BC Managed Care – PPO | Source: Ambulatory Visit | Attending: Endocrinology | Admitting: Endocrinology

## 2014-01-23 DIAGNOSIS — E049 Nontoxic goiter, unspecified: Secondary | ICD-10-CM

## 2014-09-23 ENCOUNTER — Other Ambulatory Visit (HOSPITAL_COMMUNITY): Payer: Self-pay | Admitting: Family Medicine

## 2014-09-23 DIAGNOSIS — Z1231 Encounter for screening mammogram for malignant neoplasm of breast: Secondary | ICD-10-CM

## 2014-10-15 ENCOUNTER — Ambulatory Visit (HOSPITAL_COMMUNITY)
Admission: RE | Admit: 2014-10-15 | Discharge: 2014-10-15 | Disposition: A | Discharge status: Home / Self Care | Payer: BC Managed Care – PPO | Source: Ambulatory Visit | Attending: Family Medicine | Admitting: Family Medicine

## 2014-10-15 DIAGNOSIS — Z1231 Encounter for screening mammogram for malignant neoplasm of breast: Secondary | ICD-10-CM | POA: Diagnosis not present

## 2015-02-13 ENCOUNTER — Other Ambulatory Visit: Payer: Self-pay | Admitting: Endocrinology

## 2015-02-13 DIAGNOSIS — E049 Nontoxic goiter, unspecified: Secondary | ICD-10-CM

## 2015-02-28 ENCOUNTER — Ambulatory Visit
Admission: RE | Admit: 2015-02-28 | Discharge: 2015-02-28 | Disposition: A | Discharge status: Home / Self Care | Payer: 59 | Source: Ambulatory Visit | Attending: Endocrinology | Admitting: Endocrinology

## 2015-02-28 DIAGNOSIS — E049 Nontoxic goiter, unspecified: Secondary | ICD-10-CM

## 2016-02-03 ENCOUNTER — Ambulatory Visit (INDEPENDENT_AMBULATORY_CARE_PROVIDER_SITE_OTHER): Payer: BLUE CROSS/BLUE SHIELD | Admitting: Obstetrics and Gynecology

## 2016-02-03 ENCOUNTER — Encounter: Payer: Self-pay | Admitting: *Deleted

## 2016-02-03 ENCOUNTER — Encounter: Payer: Self-pay | Admitting: Obstetrics and Gynecology

## 2016-02-03 VITALS — BP 115/74 | HR 85 | Resp 18 | Ht 64.0 in | Wt 130.0 lb

## 2016-02-03 DIAGNOSIS — D259 Leiomyoma of uterus, unspecified: Secondary | ICD-10-CM | POA: Insufficient documentation

## 2016-02-03 NOTE — Progress Notes (Signed)
Patient ID: Stacie Perry, female   DOB: [birth year], 50 y.o.   MRN: [id number] 50 yo [id number] referred from health department for the evaluation of fibroid uterus. Patient reports regular monthly 5-day cycles moderate in flow. She denies any pelvic pain/pressure, dyspareunia, abnormal bleeding or urinary symptoms.  Past Medical History  Diagnosis Date  . Neuropathic pain    No past surgical history on file. Family History  Problem Relation Age of Onset  . Hypertension Mother   . Hypertension Father    Social History  Substance Use Topics  . Smoking status: Never Smoker   . Smokeless tobacco: Not on file  . Alcohol Use: No     Comment: occasionally--wine    ROS See pertinent in HPI Blood pressure 115/74, pulse 85, resp. rate 18, height 5\' 4"  (1.626 m), weight 130 lb (58.968 kg), last menstrual period 01/14/2016.   GENERAL: Well-developed, well-nourished female in no acute distress.  ABDOMEN: Soft, nontender, nondistended. No organomegaly. PELVIC: Normal external female genitalia. Vagina is pink and rugated.  Normal discharge. Normal appearing cervix. Uterus is 14-weeks in size, very mobile. No adnexal mass or tenderness. EXTREMITIES: No cyanosis, clubbing, or edema, 2+ distal pulses.  01/05/2016 Ultrasound - 12 cm uterus with a 9 cm posterior fibroids. Normal left and right ovaries  A/P 50 yo with asymptomatic fibroid uterus - Reassurance provided to the patient - Reviewed symptoms associated with fibroid uterus and when to return to seek medical care - RTC prn

## 2016-02-03 NOTE — Patient Instructions (Signed)
Uterine Fibroids Uterine fibroids are tissue masses (tumors) that can develop in the womb (uterus). They are also called leiomyomas. This type of tumor is not cancerous (benign) and does not spread to other parts of the body outside of the pelvic area, which is between the hip bones. Occasionally, fibroids may develop in the fallopian tubes, in the cervix, or on the support structures (ligaments) that surround the uterus. You can have one or many fibroids. Fibroids can vary in size, weight, and where they grow in the uterus. Some can become quite large. Most fibroids do not require medical treatment. CAUSES A fibroid can develop when a single uterine cell keeps growing (replicating). Most cells in the human body have a control mechanism that keeps them from replicating without control. SIGNS AND SYMPTOMS Symptoms may include:   Heavy bleeding during your period.  Bleeding or spotting between periods.  Pelvic pain and pressure.  Bladder problems, such as needing to urinate more often (urinary frequency) or urgently.  Inability to reproduce offspring (infertility).  Miscarriages. DIAGNOSIS Uterine fibroids are diagnosed through a physical exam. Your health care provider may feel the lumpy tumors during a pelvic exam. Ultrasonography and an MRI may be done to determine the size, location, and number of fibroids. TREATMENT Treatment may include:  Watchful waiting. This involves getting the fibroid checked by your health care provider to see if it grows or shrinks. Follow your health care provider's recommendations for how often to have this checked.  Hormone medicines. These can be taken by mouth or given through an intrauterine device (IUD).  Surgery.  Removing the fibroids (myomectomy) or the uterus (hysterectomy).  Removing blood supply to the fibroids (uterine artery embolization). If fibroids interfere with your fertility and you want to become pregnant, your health care provider  may recommend having the fibroids removed.  HOME CARE INSTRUCTIONS  Keep all follow-up visits as directed by your health care provider. This is important.  Take medicines only as directed by your health care provider.  If you were prescribed a hormone treatment, take the hormone medicines exactly as directed.  Do not take aspirin, because it can cause bleeding.  Ask your health care provider about taking iron pills and increasing the amount of dark green, leafy vegetables in your diet. These actions can help to boost your blood iron levels, which may be affected by heavy menstrual bleeding.  Pay close attention to your period and tell your health care provider about any changes, such as:  Increased blood flow that requires you to use more pads or tampons than usual per month.  A change in the number of days that your period lasts per month.  A change in symptoms that are associated with your period, such as abdominal cramping or back pain. SEEK MEDICAL CARE IF:  You have pelvic pain, back pain, or abdominal cramps that cannot be controlled with medicines.  You have an increase in bleeding between and during periods.  You soak tampons or pads in a half hour or less.  You feel lightheaded, extra tired, or weak. SEEK IMMEDIATE MEDICAL CARE IF:  You faint.  You have a sudden increase in pelvic pain.   This information is not intended to replace advice given to you by your health care provider. Make sure you discuss any questions you have with your health care provider.   Document Released: 10/15/2000 Document Revised: 11/08/2014 Document Reviewed: 04/16/2014 Elsevier Interactive Patient Education 2016 [employer]  

## 2016-02-16 ENCOUNTER — Other Ambulatory Visit: Payer: Self-pay

## 2016-02-16 DIAGNOSIS — Z1231 Encounter for screening mammogram for malignant neoplasm of breast: Secondary | ICD-10-CM

## 2016-02-17 ENCOUNTER — Ambulatory Visit
Admission: RE | Admit: 2016-02-17 | Discharge: 2016-02-17 | Disposition: A | Discharge status: Home / Self Care | Payer: BLUE CROSS/BLUE SHIELD | Source: Ambulatory Visit

## 2016-02-17 DIAGNOSIS — Z1231 Encounter for screening mammogram for malignant neoplasm of breast: Secondary | ICD-10-CM

## 2016-03-17 ENCOUNTER — Ambulatory Visit
Admission: RE | Admit: 2016-03-17 | Discharge: 2016-03-17 | Disposition: A | Discharge status: Home / Self Care | Payer: BLUE CROSS/BLUE SHIELD | Source: Ambulatory Visit | Attending: Family Medicine | Admitting: Family Medicine

## 2016-03-17 ENCOUNTER — Other Ambulatory Visit: Payer: Self-pay | Admitting: Family Medicine

## 2016-03-17 DIAGNOSIS — R059 Cough, unspecified: Secondary | ICD-10-CM

## 2016-03-17 DIAGNOSIS — R05 Cough: Secondary | ICD-10-CM

## 2016-12-01 ENCOUNTER — Other Ambulatory Visit: Payer: Self-pay | Admitting: Endocrinology

## 2016-12-01 DIAGNOSIS — E049 Nontoxic goiter, unspecified: Secondary | ICD-10-CM

## 2017-02-10 ENCOUNTER — Ambulatory Visit
Admission: RE | Admit: 2017-02-10 | Discharge: 2017-02-10 | Disposition: A | Discharge status: Home / Self Care | Payer: BLUE CROSS/BLUE SHIELD | Source: Ambulatory Visit | Attending: Endocrinology | Admitting: Endocrinology

## 2017-02-10 DIAGNOSIS — E049 Nontoxic goiter, unspecified: Secondary | ICD-10-CM

## 2018-03-15 ENCOUNTER — Other Ambulatory Visit: Payer: Self-pay | Admitting: Family Medicine

## 2018-03-15 DIAGNOSIS — Z1231 Encounter for screening mammogram for malignant neoplasm of breast: Secondary | ICD-10-CM

## 2018-04-25 ENCOUNTER — Ambulatory Visit
Admission: RE | Admit: 2018-04-25 | Discharge: 2018-04-25 | Disposition: A | Discharge status: Home / Self Care | Payer: BLUE CROSS/BLUE SHIELD | Source: Ambulatory Visit | Attending: Family Medicine | Admitting: Family Medicine

## 2018-04-25 DIAGNOSIS — Z1231 Encounter for screening mammogram for malignant neoplasm of breast: Secondary | ICD-10-CM

## 2019-01-14 ENCOUNTER — Emergency Department (HOSPITAL_COMMUNITY)
Admission: EM | Admit: 2019-01-14 | Discharge: 2019-01-15 | Disposition: A | Discharge status: Home / Self Care | Payer: BLUE CROSS/BLUE SHIELD | Attending: Emergency Medicine | Admitting: Emergency Medicine

## 2019-01-14 ENCOUNTER — Encounter (HOSPITAL_COMMUNITY): Payer: Self-pay | Admitting: Emergency Medicine

## 2019-01-14 ENCOUNTER — Other Ambulatory Visit: Payer: Self-pay

## 2019-01-14 DIAGNOSIS — R42 Dizziness and giddiness: Secondary | ICD-10-CM | POA: Insufficient documentation

## 2019-01-14 DIAGNOSIS — Z79899 Other long term (current) drug therapy: Secondary | ICD-10-CM | POA: Insufficient documentation

## 2019-01-14 DIAGNOSIS — J111 Influenza due to unidentified influenza virus with other respiratory manifestations: Secondary | ICD-10-CM

## 2019-01-14 HISTORY — DX: Disorder of thyroid, unspecified: E07.9

## 2019-01-14 MED ORDER — ACETAMINOPHEN 325 MG PO TABS
650.0000 mg | ORAL_TABLET | Freq: Once | ORAL | Status: AC
  Administered 2019-01-15: 650 mg via ORAL
  Filled 2019-01-14: qty 2

## 2019-01-14 NOTE — ED Triage Notes (Signed)
C/o dizziness, productive cough with yellow phlegm, and generalized body aches since Friday.  Daughter tested + for flu tonight.

## 2019-01-15 ENCOUNTER — Emergency Department (HOSPITAL_COMMUNITY): Payer: BLUE CROSS/BLUE SHIELD

## 2019-01-15 LAB — INFLUENZA PANEL BY PCR (TYPE A & B)
INFLAPCR: POSITIVE — AB
Influenza B By PCR: NEGATIVE

## 2019-01-15 MED ORDER — OSELTAMIVIR PHOSPHATE 75 MG PO CAPS
75.0000 mg | ORAL_CAPSULE | Freq: Once | ORAL | Status: AC
  Administered 2019-01-15: 75 mg via ORAL
  Filled 2019-01-15: qty 1

## 2019-01-15 MED ORDER — OSELTAMIVIR PHOSPHATE 75 MG PO CAPS: 75.0000 mg | ORAL_CAPSULE | Freq: Two times a day (BID) | ORAL | 0 refills | Status: DC

## 2019-01-15 NOTE — ED Provider Notes (Signed)
[facility] DEPARTMENT Provider Note   CSN: [id number] Arrival date & time: 01/14/19  2310    History   Chief Complaint Chief Complaint  Patient presents with  . Cough  . Dizziness    HPI Stacie Perry is a 53 y.o. female.     Patient with a history of hypothyroidism and peripheral neuropathy presenting with a 3-day history of anorexia, fatigue, body aches, dizziness, productive cough of yellow sputum and chills.  Has not checked her temperature at home.  Patient describes the dizziness as a lightheaded sensation when she changes position the last for a few seconds and goes away.  Denies any vertigo or spinning sensation.  No focal weakness, numbness or tingling.  She has not checked her temperature at home.  Has been taking BC powders and TheraFlu without relief.  Her daughter was seen tonight and tested positive for the flu and patient is concerned that she has the same.  There has been no recent travel or exposure to anyone known to be coronavirus positive. She denies any chest pain, abdominal pain, shortness of breath.  Had one episode of vomiting yesterday but none since.  No diarrhea.  The history is provided by the patient.  Cough  Associated symptoms: myalgias and rhinorrhea   Associated symptoms: no chest pain, no headaches and no shortness of breath   Dizziness  Associated symptoms: weakness   Associated symptoms: no chest pain, no headaches, no shortness of breath and no vomiting     Past Medical History:  Diagnosis Date  . Neuropathic pain   . Thyroid disease     Patient Active Problem List   Diagnosis Date Noted  . Fibroid uterus 02/03/2016    Past Surgical History:  Procedure Laterality Date  . dilatation and curretage  2008     OB History    Gravida  3   Para  1   Term  1   Preterm      AB  2   Living  1     SAB  2   TAB      Ectopic      Multiple      Live Births  1            Home  Medications    Prior to Admission medications   Medication Sig Start Date End Date Taking? Authorizing Provider  amitriptyline (ELAVIL) 150 MG tablet Take 150 mg by mouth at bedtime.      [provider]  Biotin 10 MG TABS Take 1 tablet by mouth daily.    [provider]  calcium-vitamin D (OSCAL WITH D) 250-125 MG-UNIT per tablet Take 1 tablet by mouth daily.    [provider]  gabapentin (NEURONTIN) 300 MG capsule Take 300 mg by mouth 3 (three) times daily.    [provider]  ibuprofen (MOTRIN IB) 200 MG tablet Take 2 tablets (400 mg total) by mouth every 6 (six) hours as needed for pain. Patient not taking: Reported on 02/03/2016 04/13/13   Debbe Odea, MD  levothyroxine (SYNTHROID, LEVOTHROID) 25 MCG tablet Take 25 mcg by mouth daily before breakfast.    [provider]  Multiple Vitamin (MULTIVITAMIN WITH MINERALS) TABS Take 1 tablet by mouth daily.    [provider]  Norethindrone-Ethinyl Estradiol Triphasic (TRI-NORINYL, 28,) 0.5/1/0.5-35 MG-MCG tablet Take 1 tablet by mouth daily.      [provider]  temazepam (RESTORIL) 15 MG capsule Take 15 mg  by mouth at bedtime.    [provider]    Family History Family History  Problem Relation Age of Onset  . Hypertension Mother   . Hypertension Father     Social History Social History   Tobacco Use  . Smoking status: Never Smoker  . Smokeless tobacco: Never Used  Substance Use Topics  . Alcohol use: No    Comment: occasionally--wine   . Drug use: No     Allergies   Patient has no known allergies.   Review of Systems Review of Systems  Constitutional: Positive for activity change, appetite change and fatigue.  HENT: Positive for rhinorrhea. Negative for congestion.   Respiratory: Positive for cough. Negative for shortness of breath.   Cardiovascular: Negative for chest pain.  Gastrointestinal: Negative for abdominal pain and vomiting.   Genitourinary: Negative for dysuria, vaginal bleeding and vaginal discharge.  Musculoskeletal: Positive for arthralgias and myalgias. Negative for back pain and neck pain.  Neurological: Positive for dizziness, weakness and light-headedness. Negative for headaches.   all other systems are negative except as noted in the HPI and PMH.     Physical Exam Updated Vital Signs BP 114/67 (BP Location: Left Arm)   Pulse 89   Temp 98.7 F (37.1 C) (Oral)   Resp 18   LMP 12/28/2018   SpO2 100%   Physical Exam Vitals signs and nursing note reviewed.  Constitutional:      General: She is not in acute distress.    Appearance: Normal appearance. She is well-developed and normal weight. She is not ill-appearing.  HENT:     Head: Normocephalic and atraumatic.     Nose: Congestion and rhinorrhea present.     Mouth/Throat:     Mouth: Mucous membranes are moist.     Pharynx: Posterior oropharyngeal erythema present. No oropharyngeal exudate.  Eyes:     Conjunctiva/sclera: Conjunctivae normal.     Pupils: Pupils are equal, round, and reactive to light.  Neck:     Musculoskeletal: Normal range of motion and neck supple.     Comments: No meningismus. Cardiovascular:     Rate and Rhythm: Normal rate and regular rhythm.     Heart sounds: Normal heart sounds. No murmur.  Pulmonary:     Effort: Pulmonary effort is normal. No respiratory distress.     Breath sounds: Normal breath sounds.  Abdominal:     Palpations: Abdomen is soft.     Tenderness: There is no abdominal tenderness. There is no guarding or rebound.  Musculoskeletal: Normal range of motion.        General: No tenderness.  Skin:    General: Skin is warm.     Capillary Refill: Capillary refill takes less than 2 seconds.  Neurological:     General: No focal deficit present.     Mental Status: She is alert and oriented to person, place, and time. Mental status is at baseline.     Cranial Nerves: No cranial nerve deficit.     Motor:  No abnormal muscle tone.     Coordination: Coordination normal.     Comments: CN 2-12 intact, no ataxia on finger to nose, no nystagmus, 5/5 strength throughout, no pronator drift, Romberg negative, normal gait.   Psychiatric:        Behavior: Behavior normal.      ED Treatments / Results  Labs (all labs ordered are listed, but only abnormal results are displayed) Labs Reviewed  INFLUENZA PANEL BY PCR (TYPE A & B) -  Abnormal; Notable for the following components:      Result Value   Influenza A By PCR POSITIVE (*)    All other components within normal limits    EKG None  Radiology Dg Chest 2 View  Result Date: 01/15/2019 CLINICAL DATA:  C/o dizziness, productive cough with yellow phlegm, and generalized body aches since Friday. Daughter tested + for flu tonight. EXAM: CHEST - 2 VIEW COMPARISON:  03/17/2016 FINDINGS: The heart size and mediastinal contours are within normal limits. Both lungs are clear. No pleural effusion or pneumothorax. The visualized skeletal structures are unremarkable. IMPRESSION: No active cardiopulmonary disease. Electronically Signed   By: Lajean Manes M.D.   On: 01/15/2019 00:18    Procedures Procedures (including critical care time)  Medications Ordered in ED Medications  acetaminophen (TYLENOL) tablet 650 mg (has no administration in time range)     Initial Impression / Assessment and Plan / ED Course  I have reviewed the triage vital signs and the nursing notes.  Pertinent labs & imaging results that were available during my care of the patient were reviewed by me and considered in my medical decision making (see chart for details).       3 days of body aches, anorexia, cough and chills.  Positive flu exposure at home.  No recent travel.  Intact neuro exam without evidence of ataxia or nystagmus. Normal gait, negative romberg.  Patient is well-hydrated and nontoxic and not hypoxic. Lungs are clear to auscultation.  She will be given  p.o. fluids and antipyretics.  Chest x-ray negative.  Patient will be treated for influenza  Patient will be treated with p.o. fluids, antipyretics.  Discussed PCP follow-up.  Return to the ED with worsening symptoms including not able to eat or drink, difficulty breathing or other concerns.  Final Clinical Impressions(s) / ED Diagnoses   Final diagnoses:  Influenza    ED Discharge Orders    None       Jerri Hargadon, Annie Main, MD 01/15/19 [phone]

## 2019-01-15 NOTE — Discharge Instructions (Addendum)
Keep yourself hydrated.  Use Tylenol or ibuprofen as needed for aches and fever.  Return to the ED if you are not able to eat or drink or having difficulty breathing or other concerns.

## 2019-02-06 ENCOUNTER — Other Ambulatory Visit: Payer: Self-pay | Admitting: Endocrinology

## 2019-02-06 DIAGNOSIS — E049 Nontoxic goiter, unspecified: Secondary | ICD-10-CM

## 2019-03-23 ENCOUNTER — Other Ambulatory Visit: Payer: BLUE CROSS/BLUE SHIELD

## 2019-04-05 ENCOUNTER — Other Ambulatory Visit: Payer: BLUE CROSS/BLUE SHIELD

## 2019-04-24 ENCOUNTER — Ambulatory Visit
Admission: RE | Admit: 2019-04-24 | Discharge: 2019-04-24 | Disposition: A | Discharge status: Home / Self Care | Payer: Self-pay | Source: Ambulatory Visit | Attending: Endocrinology | Admitting: Endocrinology

## 2019-04-24 ENCOUNTER — Other Ambulatory Visit: Payer: Self-pay

## 2019-04-24 DIAGNOSIS — E049 Nontoxic goiter, unspecified: Secondary | ICD-10-CM

## 2021-02-19 ENCOUNTER — Other Ambulatory Visit: Payer: Self-pay | Admitting: Endocrinology

## 2021-02-19 DIAGNOSIS — E049 Nontoxic goiter, unspecified: Secondary | ICD-10-CM

## 2021-03-02 ENCOUNTER — Other Ambulatory Visit: Payer: Self-pay

## 2021-03-02 ENCOUNTER — Ambulatory Visit
Admission: RE | Admit: 2021-03-02 | Discharge: 2021-03-02 | Disposition: A | Discharge status: Home / Self Care | Payer: 59 | Source: Ambulatory Visit | Attending: Endocrinology | Admitting: Endocrinology

## 2021-03-02 DIAGNOSIS — E049 Nontoxic goiter, unspecified: Secondary | ICD-10-CM

## 2021-03-05 ENCOUNTER — Other Ambulatory Visit: Payer: Self-pay | Admitting: Family Medicine

## 2021-03-05 DIAGNOSIS — Z1231 Encounter for screening mammogram for malignant neoplasm of breast: Secondary | ICD-10-CM

## 2021-04-28 ENCOUNTER — Ambulatory Visit
Admission: RE | Admit: 2021-04-28 | Discharge: 2021-04-28 | Disposition: A | Discharge status: Home / Self Care | Payer: 59 | Source: Ambulatory Visit | Attending: Family Medicine | Admitting: Family Medicine

## 2021-04-28 ENCOUNTER — Other Ambulatory Visit: Payer: Self-pay

## 2021-04-28 DIAGNOSIS — Z1231 Encounter for screening mammogram for malignant neoplasm of breast: Secondary | ICD-10-CM

## 2022-07-12 ENCOUNTER — Other Ambulatory Visit: Payer: Self-pay | Admitting: Family Medicine

## 2022-07-12 DIAGNOSIS — Z1231 Encounter for screening mammogram for malignant neoplasm of breast: Secondary | ICD-10-CM

## 2022-08-23 ENCOUNTER — Ambulatory Visit
Admission: RE | Admit: 2022-08-23 | Discharge: 2022-08-23 | Disposition: A | Discharge status: Home / Self Care | Payer: Commercial Managed Care - HMO | Source: Ambulatory Visit | Attending: Family Medicine | Admitting: Family Medicine

## 2022-08-23 DIAGNOSIS — Z1231 Encounter for screening mammogram for malignant neoplasm of breast: Secondary | ICD-10-CM

## 2022-11-10 DIAGNOSIS — I10 Essential (primary) hypertension: Secondary | ICD-10-CM | POA: Diagnosis not present

## 2022-11-10 DIAGNOSIS — R7989 Other specified abnormal findings of blood chemistry: Secondary | ICD-10-CM | POA: Diagnosis not present

## 2022-11-10 DIAGNOSIS — E782 Mixed hyperlipidemia: Secondary | ICD-10-CM | POA: Diagnosis not present

## 2022-11-10 DIAGNOSIS — E039 Hypothyroidism, unspecified: Secondary | ICD-10-CM | POA: Diagnosis not present

## 2022-12-02 ENCOUNTER — Other Ambulatory Visit: Payer: Self-pay

## 2022-12-02 ENCOUNTER — Inpatient Hospital Stay: Payer: 59 | Attending: Internal Medicine | Admitting: Internal Medicine

## 2022-12-02 ENCOUNTER — Other Ambulatory Visit: Payer: Self-pay | Admitting: Internal Medicine

## 2022-12-02 ENCOUNTER — Encounter: Payer: Self-pay | Admitting: Internal Medicine

## 2022-12-02 ENCOUNTER — Inpatient Hospital Stay: Payer: 59

## 2022-12-02 VITALS — BP 107/85 | HR 94 | Temp 98.3°F | Resp 17 | Ht 64.0 in | Wt 131.7 lb

## 2022-12-02 DIAGNOSIS — E785 Hyperlipidemia, unspecified: Secondary | ICD-10-CM | POA: Insufficient documentation

## 2022-12-02 DIAGNOSIS — D72819 Decreased white blood cell count, unspecified: Secondary | ICD-10-CM | POA: Diagnosis not present

## 2022-12-02 DIAGNOSIS — Z79899 Other long term (current) drug therapy: Secondary | ICD-10-CM | POA: Diagnosis not present

## 2022-12-02 DIAGNOSIS — I10 Essential (primary) hypertension: Secondary | ICD-10-CM | POA: Diagnosis not present

## 2022-12-02 DIAGNOSIS — E039 Hypothyroidism, unspecified: Secondary | ICD-10-CM | POA: Insufficient documentation

## 2022-12-02 DIAGNOSIS — E876 Hypokalemia: Secondary | ICD-10-CM | POA: Diagnosis not present

## 2022-12-02 DIAGNOSIS — Z79890 Hormone replacement therapy (postmenopausal): Secondary | ICD-10-CM | POA: Insufficient documentation

## 2022-12-02 DIAGNOSIS — Z8249 Family history of ischemic heart disease and other diseases of the circulatory system: Secondary | ICD-10-CM | POA: Diagnosis not present

## 2022-12-02 LAB — CMP (CANCER CENTER ONLY)
ALT: 19 U/L (ref 0–44)
AST: 22 U/L (ref 15–41)
Albumin: 4.7 g/dL (ref 3.5–5.0)
Alkaline Phosphatase: 64 U/L (ref 38–126)
Anion gap: 8 (ref 5–15)
BUN: 15 mg/dL (ref 6–20)
CO2: 29 mmol/L (ref 22–32)
Calcium: 10.2 mg/dL (ref 8.9–10.3)
Chloride: 102 mmol/L (ref 98–111)
Creatinine: 0.89 mg/dL (ref 0.44–1.00)
GFR, Estimated: >60 mL/min (ref >60)
Glucose, Bld: 71 mg/dL (ref 70–99)
Potassium: 3.4 mmol/L — ABNORMAL LOW (ref 3.5–5.1)
Sodium: 139 mmol/L (ref 135–145)
Total Bilirubin: 0.6 mg/dL (ref 0.3–1.2)
Total Protein: 8.4 g/dL — ABNORMAL HIGH (ref 6.5–8.1)

## 2022-12-02 LAB — CBC WITH DIFFERENTIAL (CANCER CENTER ONLY)
Abs Immature Granulocytes: 0 10*3/uL (ref 0.00–0.07)
Basophils Absolute: 0 10*3/uL (ref 0.0–0.1)
Basophils Relative: 0 %
Eosinophils Absolute: 0 10*3/uL (ref 0.0–0.5)
Eosinophils Relative: 1 %
HCT: 43.8 % (ref 36.0–46.0)
Hemoglobin: 15.1 g/dL — ABNORMAL HIGH (ref 12.0–15.0)
Immature Granulocytes: 0 %
Lymphocytes Relative: 49 %
Lymphs Abs: 2 10*3/uL (ref 0.7–4.0)
MCH: 31.5 pg (ref 26.0–34.0)
MCHC: 34.5 g/dL (ref 30.0–36.0)
MCV: 91.4 fL (ref 80.0–100.0)
Monocytes Absolute: 0.3 10*3/uL (ref 0.1–1.0)
Monocytes Relative: 8 %
Neutro Abs: 1.7 10*3/uL (ref 1.7–7.7)
Neutrophils Relative %: 42 %
Platelet Count: 245 10*3/uL (ref 150–400)
RBC: 4.79 MIL/uL (ref 3.87–5.11)
RDW: 12.2 % (ref 11.5–15.5)
WBC Count: 4 10*3/uL (ref 4.0–10.5)
nRBC: 0 % (ref 0.0–0.2)

## 2022-12-02 LAB — LACTATE DEHYDROGENASE: LDH: 151 U/L (ref 98–192)

## 2022-12-02 LAB — HEPATITIS PANEL, ACUTE
HCV Ab: NONREACTIVE
Hep A IgM: NONREACTIVE
Hep B C IgM: NONREACTIVE
Hepatitis B Surface Ag: NONREACTIVE

## 2022-12-02 LAB — FOLATE: Folate: 33.6 ng/mL (ref >5.9)

## 2022-12-02 LAB — IRON AND IRON BINDING CAPACITY (CC-WL,HP ONLY)
Iron: 90 ug/dL (ref 28–170)
Saturation Ratios: 26 % (ref 10.4–31.8)
TIBC: 344 ug/dL (ref 250–450)
UIBC: 254 ug/dL (ref 148–442)

## 2022-12-02 LAB — HIV ANTIBODY (ROUTINE TESTING W REFLEX): HIV Screen 4th Generation wRfx: NONREACTIVE

## 2022-12-02 LAB — FERRITIN: Ferritin: 250 ng/mL (ref 11–307)

## 2022-12-02 LAB — VITAMIN B12: Vitamin B-12: 869 pg/mL (ref 180–914)

## 2022-12-02 NOTE — Progress Notes (Signed)
[facility] [phone]   [phone]  CONSULT NOTE  REFERRING PHYSICIAN: Dr. Lin Landsman  REASON FOR CONSULTATION:  57 years old white female for evaluation of leukocytopenia.  HPI Stacie Perry is a 57 y.o. female with past medical history significant for hypertension, hypothyroidism as well as dyslipidemia.  The patient was seen recently by her primary care provider for routine evaluation and management of her hypertension and dyslipidemia.  She had CBC on 07/27/2022 that showed mildly decreased total white blood count of 3.6.  Her absolute neutrophil count was 1400.  The patient had normal hemoglobin of 14.4 and hematocrit 42.7%.  She was followed by close monitoring and repeat CBC on November 05, 2022 showed further decrease of her total white blood count down to 2.9 with absolute neutrophil count of 1200.  She also has normal hemoglobin and hematocrit as well as platelets count.  The patient was referred to me today for evaluation and recommendation regarding her condition. When seen today she is feeling fine with no concerning complaints.  She denied having any recurrent infection.  She denied having any bleeding, bruises or ecchymosis.  She has no nausea, vomiting, diarrhea or constipation.  She has no chest pain, shortness of breath, cough or hemoptysis.  She has no recent weight loss or night sweats. Family history significant for mother and father with hypertension but no family history of malignancy or blood disorder. The patient is married and was accompanied by her husband Tim.  She has no history for smoking, alcohol or drug abuse.  HPI  Past Medical History:  Diagnosis Date   Neuropathic pain    Thyroid disease     Past Surgical History:  Procedure Laterality Date   BREAST BIOPSY Right    dilatation and curretage  11/01/2006    Family History  Problem Relation Age of Onset   Hypertension Mother    Hypertension Father      Social History Social History   Tobacco Use   Smoking status: Never   Smokeless tobacco: Never  Substance Use Topics   Alcohol use: No    Comment: occasionally--wine    Drug use: No    No Known Allergies  Current Outpatient Medications  Medication Sig Dispense Refill   amitriptyline (ELAVIL) 150 MG tablet Take 150 mg by mouth at bedtime.       Biotin 10 MG TABS Take 1 tablet by mouth daily.     calcium-vitamin D (OSCAL WITH D) 250-125 MG-UNIT per tablet Take 1 tablet by mouth daily.     gabapentin (NEURONTIN) 300 MG capsule Take 300 mg by mouth 3 (three) times daily.     ibuprofen (MOTRIN IB) 200 MG tablet Take 2 tablets (400 mg total) by mouth every 6 (six) hours as needed for pain. (Patient not taking: Reported on 02/03/2016) 30 tablet 0   levothyroxine (SYNTHROID, LEVOTHROID) 25 MCG tablet Take 25 mcg by mouth daily before breakfast.     Multiple Vitamin (MULTIVITAMIN WITH MINERALS) TABS Take 1 tablet by mouth daily.     Norethindrone-Ethinyl Estradiol Triphasic (TRI-NORINYL, 28,) 0.5/1/0.5-35 MG-MCG tablet Take 1 tablet by mouth daily.       oseltamivir (TAMIFLU) 75 MG capsule Take 1 capsule (75 mg total) by mouth every 12 (twelve) hours. 10 capsule 0   temazepam (RESTORIL) 15 MG capsule Take 15 mg by mouth at bedtime.     No current facility-administered medications for this visit.    Review of Systems  Constitutional: negative Eyes: negative Ears, nose, mouth, throat, and face: negative Respiratory: negative Cardiovascular: negative Gastrointestinal: negative Genitourinary:negative Integument/breast: negative Hematologic/lymphatic: negative Musculoskeletal:negative Neurological: negative Behavioral/Psych: negative Endocrine: negative Allergic/Immunologic: negative  Physical Exam  MPN:TIRWE, healthy, no distress, well nourished, well developed, and anxious SKIN: skin color, texture, turgor are normal, no rashes or significant lesions HEAD: Normocephalic, No  masses, lesions, tenderness or abnormalities EYES: normal, PERRLA, Conjunctiva are pink and non-injected EARS: External ears normal, Canals clear OROPHARYNX:no exudate, no erythema, and lips, buccal mucosa, and tongue normal  NECK: supple, no adenopathy, no JVD LYMPH:  no palpable lymphadenopathy, no hepatosplenomegaly BREAST:not examined LUNGS: clear to auscultation , and palpation HEART: regular rate & rhythm, no murmurs, and no gallops ABDOMEN:abdomen soft, non-tender, normal bowel sounds, and no masses or organomegaly BACK: Back symmetric, no curvature., No CVA tenderness EXTREMITIES:no joint deformities, effusion, or inflammation, no edema  NEURO: alert & oriented x 3 with fluent speech, no focal motor/sensory deficits  PERFORMANCE STATUS: ECOG 0  LABORATORY DATA: Lab Results  Component Value Date   WBC 4.0 12/02/2022   HGB 15.1 (H) 12/02/2022   HCT 43.8 12/02/2022   MCV 91.4 12/02/2022   PLT 245 12/02/2022      Chemistry      Component Value Date/Time   NA 135 05/23/2012 0915   K 3.4 (L) 05/23/2012 0915   CL 100 05/23/2012 0915   CO2 24 05/23/2012 0915   BUN 9 05/23/2012 0915   CREATININE 0.85 05/23/2012 0915      Component Value Date/Time   CALCIUM 9.3 05/23/2012 0915   ALKPHOS 45 05/23/2012 0915   AST 21 05/23/2012 0915   ALT 17 05/23/2012 0915   BILITOT 0.4 05/23/2012 0915       RADIOGRAPHIC STUDIES: No results found.  ASSESSMENT: This is a very pleasant 57 years old African-American female who came today for evaluation of leukocytopenia that was seen on previous blood work by her primary care provider.   PLAN: I had a lengthy discussion with the patient and her husband today about her current condition and further investigation to rule out any underlying abnormalities for her leukocytopenia. I order CBC today that showed normal white blood count of 4.0 with mildly elevated hemoglobin of 15.1 and hematocrit 43.8 and normal platelets count of 245,000.   Comprehensive metabolic panel was unremarkable except for mild hypokalemia.  Iron study and ferritin were normal.  She had normal vitamin B12 and serum folate.  The patient also has normal rheumatoid factor and HIV test as well as hepatitis panel.  ANA is still pending. I explained to the patient that her leukocytopenia that was seen previously was likely drug-induced from her cholesterol medication but also over-the-counter NSAIDs could be contributing factor.  It could be also ethnic in origin with low normal white blood count and African-American. I do not see any concerning findings so far on this patient his blood work. I recommended for her to continue her routine follow-up visit and evaluation by her primary care physician but if she has persistently and worsening leukocytopenia, I will be happy to see her again and we may consider a bone marrow biopsy and aspirate at that time but I do not see a need for it right now. The patient and her husband are in agreement with the current plan. She was advised to call if she has any concerning symptoms. The patient voices understanding of current disease status and treatment options and is in agreement with the current care plan.  All questions were answered. The patient knows to call the clinic with any problems, questions or concerns. We can certainly see the patient much sooner if necessary.  Thank you so much for allowing me to participate in the care of Stacie Perry. I will continue to follow up the patient with you and assist in her care.  The total time spent in the appointment was 60 minutes.  Disclaimer: This note was dictated with voice recognition software. Similar sounding words can inadvertently be transcribed and may not be corrected upon review.   Eilleen Kempf December 02, 2022, 12:05 PM

## 2022-12-03 DIAGNOSIS — D72819 Decreased white blood cell count, unspecified: Secondary | ICD-10-CM | POA: Insufficient documentation

## 2022-12-03 LAB — RHEUMATOID FACTOR: Rheumatoid fact SerPl-aCnc: 10.5 IU/mL (ref <14.0)

## 2022-12-04 LAB — ANTINUCLEAR ANTIBODIES, IFA: ANA Ab, IFA: NEGATIVE

## 2023-02-06 DIAGNOSIS — B360 Pityriasis versicolor: Secondary | ICD-10-CM | POA: Diagnosis not present

## 2023-02-06 DIAGNOSIS — Z6822 Body mass index (BMI) 22.0-22.9, adult: Secondary | ICD-10-CM | POA: Diagnosis not present

## 2023-02-22 DIAGNOSIS — L42 Pityriasis rosea: Secondary | ICD-10-CM | POA: Diagnosis not present

## 2023-04-15 IMAGING — MG MM DIGITAL SCREENING BILAT W/ TOMO AND CAD
6 of 10 series · 6 of 30 positions shown · non-contrast
Comparison: Previous exam(s).

CLINICAL DATA: Screening.

EXAM:
DIGITAL SCREENING BILATERAL MAMMOGRAM WITH TOMOSYNTHESIS AND CAD
TECHNIQUE: Bilateral screening digital craniocaudal and mediolateral oblique
mammograms were obtained. Bilateral screening digital breast
tomosynthesis was performed. The images were evaluated with
computer-aided detection.

[R CC synth-2D]
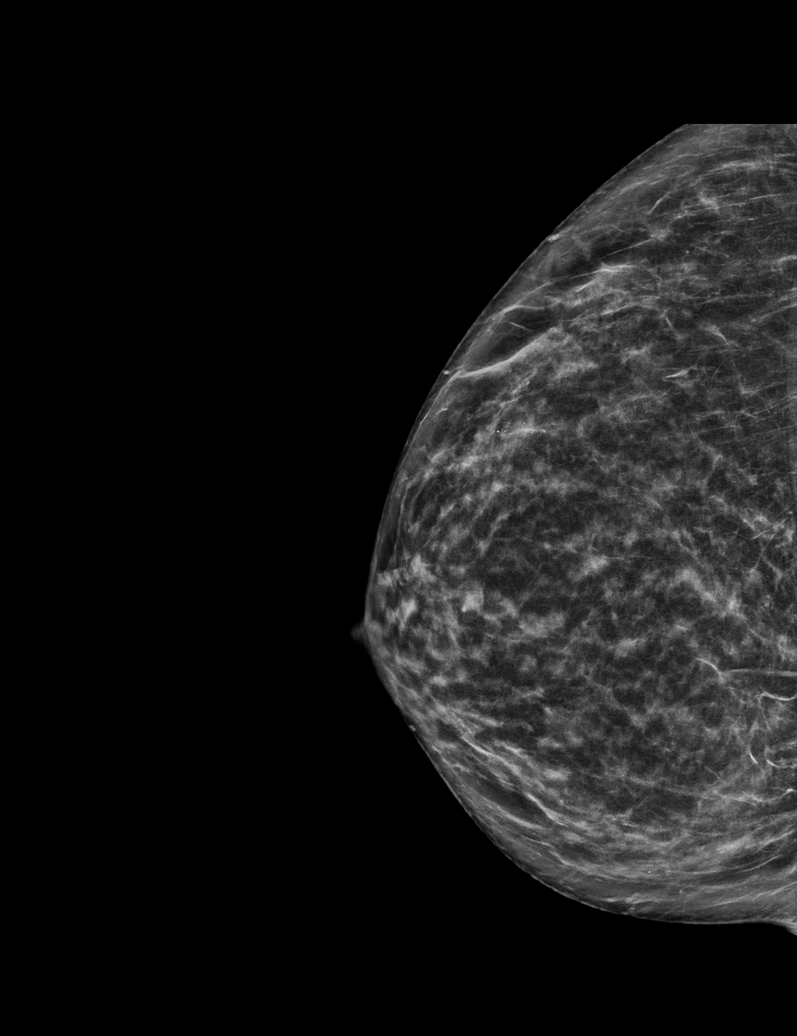

[R MLO synth-2D]
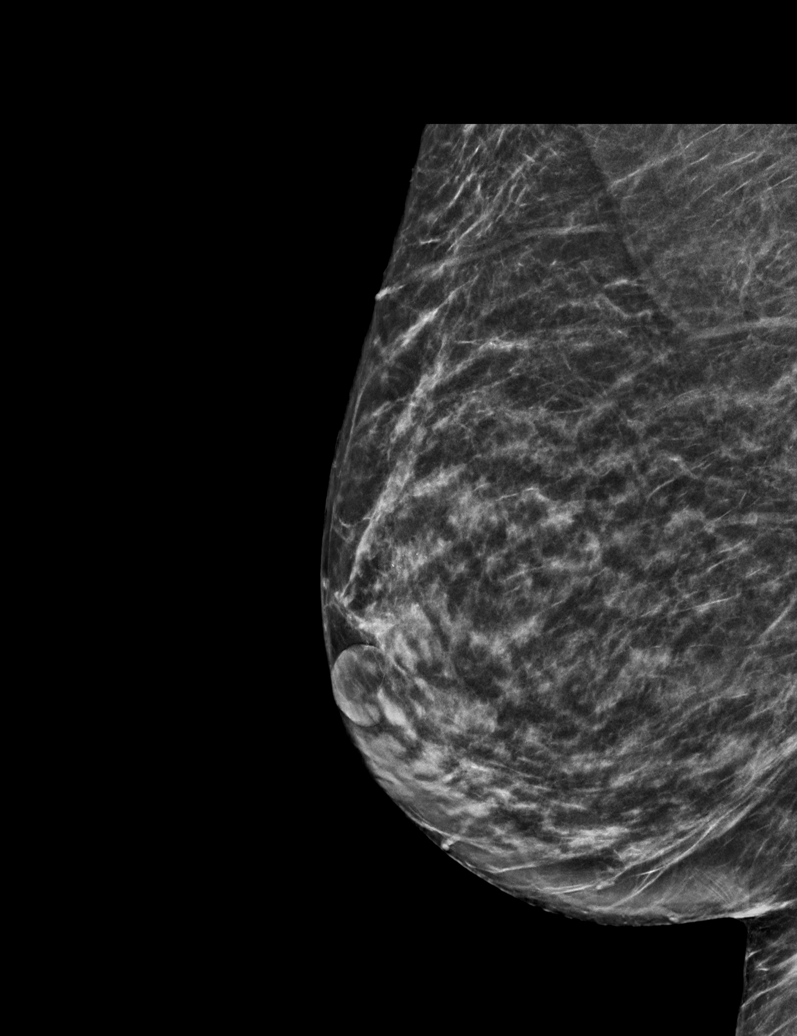

[L MLO synth-2D (1 of 2)]
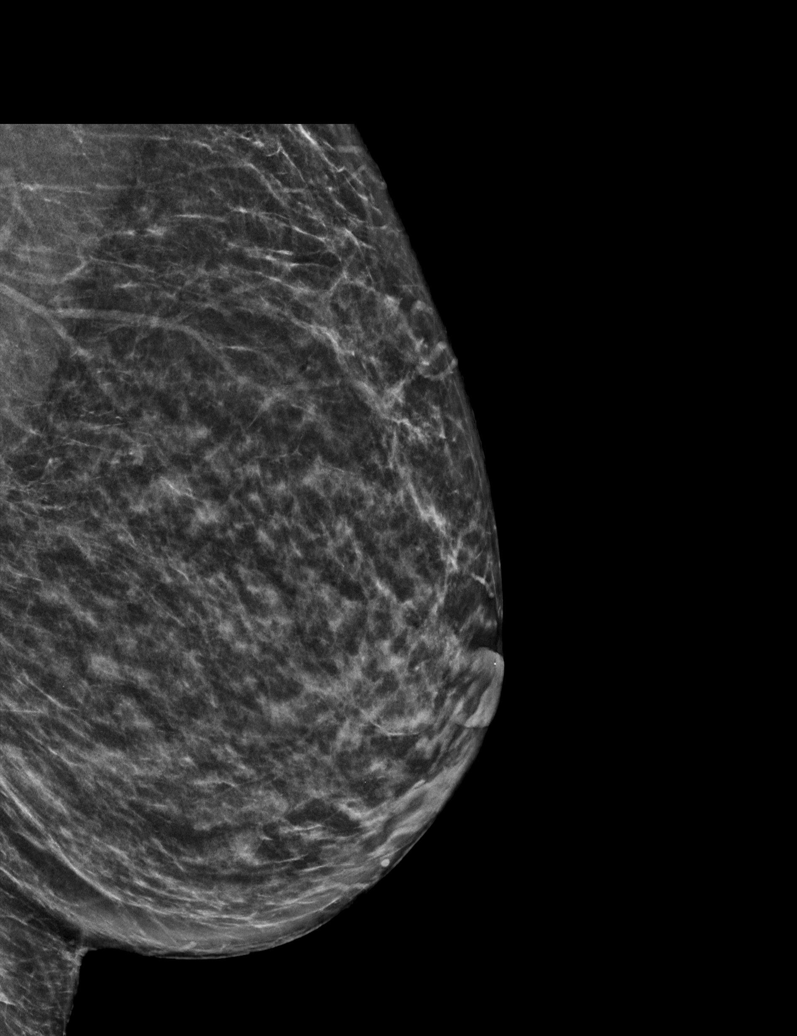

[L CC synth-2D]
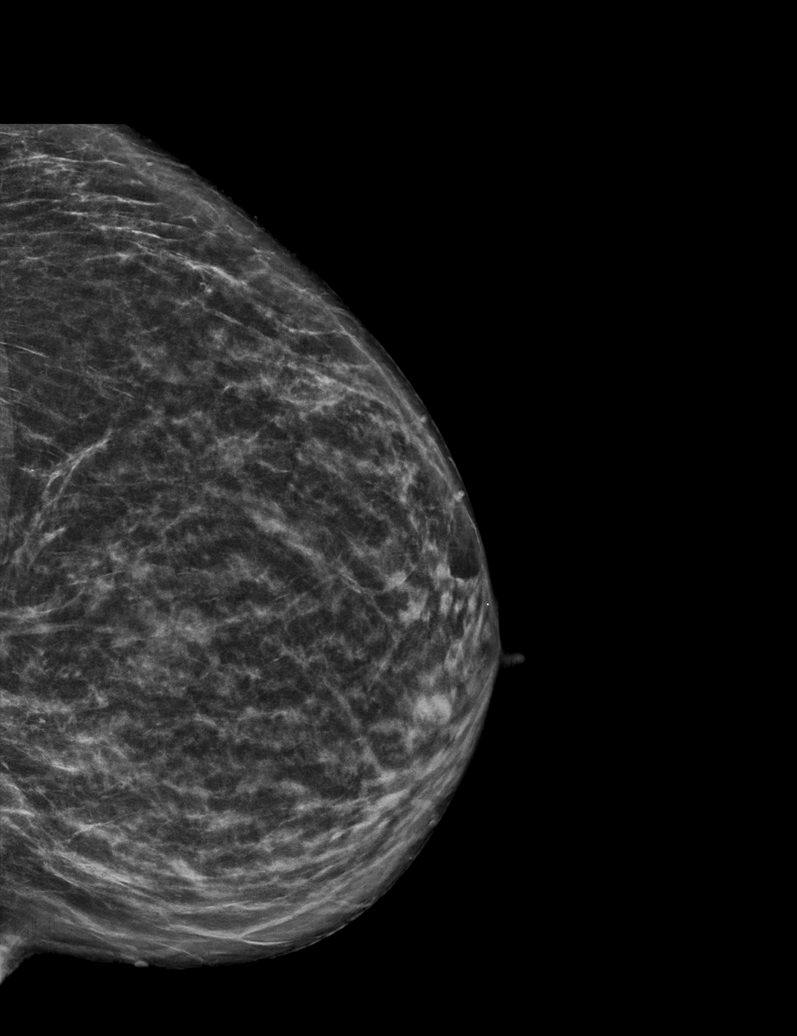

[L MLO synth-2D (2 of 2)]
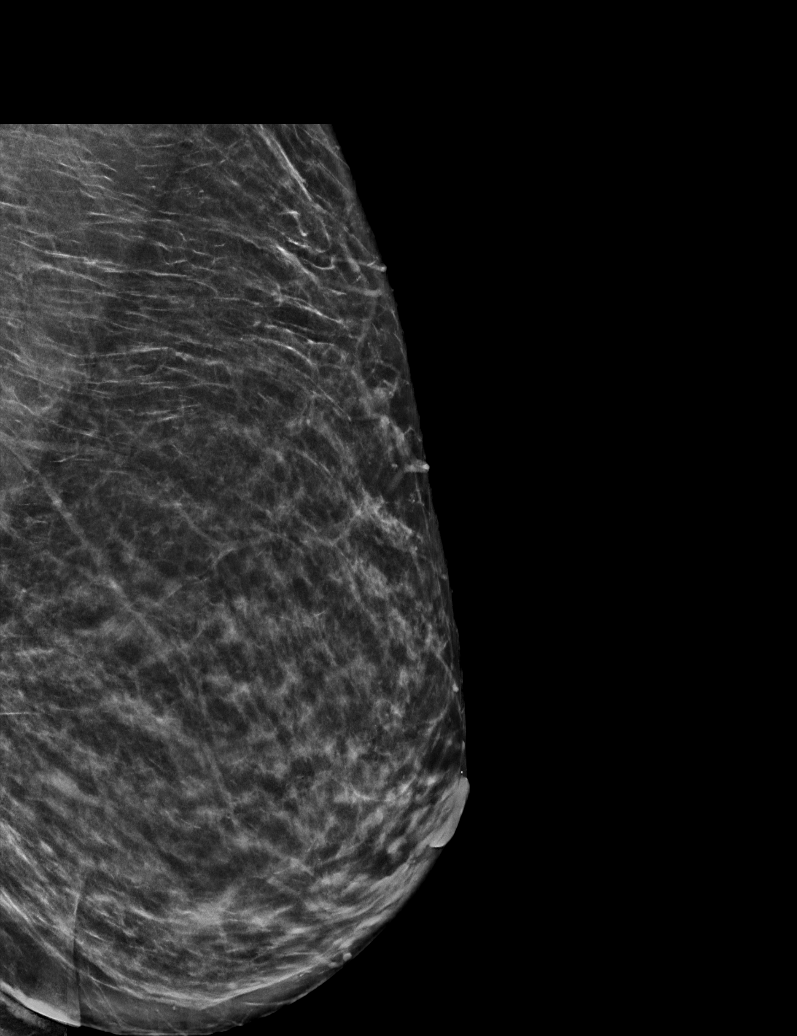

[L CC tomo · tomo slice 31/61.0]
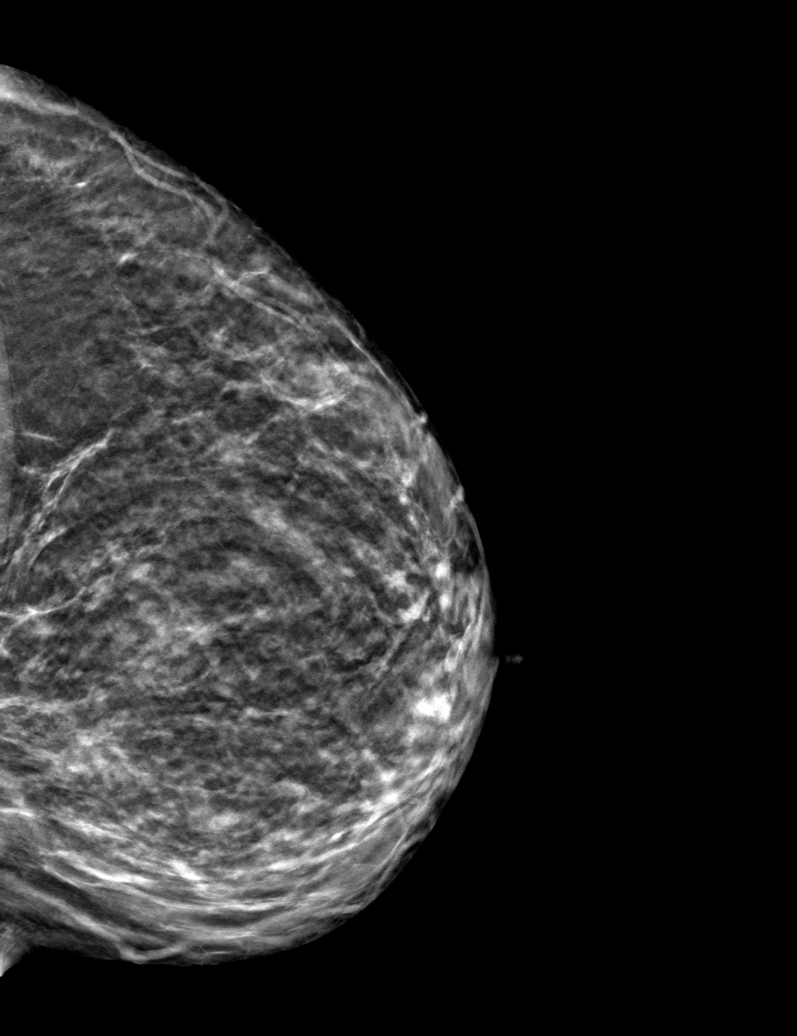

[6 of 30 positions shown; findings below may reference images not displayed]

ACR Breast Density Category c: The breast tissue is heterogeneously
dense, which may obscure small masses.
FINDINGS: There are no findings suspicious for malignancy.
IMPRESSION: No mammographic evidence of malignancy. A result letter of this
screening mammogram will be mailed directly to the patient.

RECOMMENDATION:
Screening mammogram in one year. (Code:Q3-W-BC3)

BI-RADS CATEGORY  1: Negative.

## 2023-09-12 DIAGNOSIS — Z23 Encounter for immunization: Secondary | ICD-10-CM | POA: Diagnosis not present

## 2023-10-21 DIAGNOSIS — E785 Hyperlipidemia, unspecified: Secondary | ICD-10-CM | POA: Diagnosis not present

## 2023-10-21 DIAGNOSIS — R5383 Other fatigue: Secondary | ICD-10-CM | POA: Diagnosis not present

## 2023-10-28 DIAGNOSIS — R32 Unspecified urinary incontinence: Secondary | ICD-10-CM | POA: Diagnosis not present

## 2023-10-28 DIAGNOSIS — E782 Mixed hyperlipidemia: Secondary | ICD-10-CM | POA: Diagnosis not present

## 2023-10-28 DIAGNOSIS — Z789 Other specified health status: Secondary | ICD-10-CM | POA: Diagnosis not present

## 2023-10-28 DIAGNOSIS — E039 Hypothyroidism, unspecified: Secondary | ICD-10-CM | POA: Diagnosis not present

## 2023-10-28 DIAGNOSIS — I10 Essential (primary) hypertension: Secondary | ICD-10-CM | POA: Diagnosis not present
